# Patient Record
Sex: Female | Born: 1955 | Race: White | Hispanic: No | Marital: Married | Smoking: Former smoker
Health system: Southern US, Community
[De-identification: ages and names within clinical notes are randomized; demographics above are authoritative.]

## PROBLEM LIST (undated history)

## (undated) DIAGNOSIS — R112 Nausea with vomiting, unspecified: Secondary | ICD-10-CM

## (undated) DIAGNOSIS — N838 Other noninflammatory disorders of ovary, fallopian tube and broad ligament: Secondary | ICD-10-CM

## (undated) DIAGNOSIS — Z8719 Personal history of other diseases of the digestive system: Secondary | ICD-10-CM

## (undated) DIAGNOSIS — J302 Other seasonal allergic rhinitis: Secondary | ICD-10-CM

## (undated) DIAGNOSIS — K219 Gastro-esophageal reflux disease without esophagitis: Secondary | ICD-10-CM

## (undated) DIAGNOSIS — Z8711 Personal history of peptic ulcer disease: Secondary | ICD-10-CM

## (undated) DIAGNOSIS — N840 Polyp of corpus uteri: Secondary | ICD-10-CM

## (undated) DIAGNOSIS — K5000 Crohn's disease of small intestine without complications: Secondary | ICD-10-CM

## (undated) DIAGNOSIS — Z9889 Other specified postprocedural states: Secondary | ICD-10-CM

## (undated) DIAGNOSIS — M1990 Unspecified osteoarthritis, unspecified site: Secondary | ICD-10-CM

## (undated) DIAGNOSIS — Z87442 Personal history of urinary calculi: Secondary | ICD-10-CM

## (undated) HISTORY — PX: KIDNEY SURGERY: SHX687

## (undated) HISTORY — PX: TONSILLECTOMY AND ADENOIDECTOMY: SUR1326

## (undated) HISTORY — PX: CYSTOSCOPY/RETROGRADE/URETEROSCOPY/STONE EXTRACTION WITH BASKET: SHX5317

## (undated) HISTORY — DX: Gastro-esophageal reflux disease without esophagitis: K21.9

---

## 2002-02-15 HISTORY — PX: MICROLARYNGOSCOPY WITH CO2 LASER AND EXCISION OF VOCAL CORD LESION: SHX5970

## 2008-02-16 HISTORY — PX: NASAL SINUS SURGERY: SHX719

## 2016-11-30 ENCOUNTER — Encounter: Payer: Self-pay | Admitting: Nurse Practitioner

## 2016-12-14 ENCOUNTER — Encounter: Payer: Self-pay | Admitting: Nurse Practitioner

## 2016-12-14 ENCOUNTER — Ambulatory Visit (INDEPENDENT_AMBULATORY_CARE_PROVIDER_SITE_OTHER): Payer: BLUE CROSS/BLUE SHIELD | Admitting: Nurse Practitioner

## 2016-12-14 ENCOUNTER — Encounter (INDEPENDENT_AMBULATORY_CARE_PROVIDER_SITE_OTHER): Payer: Self-pay

## 2016-12-14 VITALS — BP 118/76 | HR 74 | Ht 65.0 in | Wt 165.0 lb

## 2016-12-14 DIAGNOSIS — R1084 Generalized abdominal pain: Secondary | ICD-10-CM | POA: Diagnosis not present

## 2016-12-14 DIAGNOSIS — R109 Unspecified abdominal pain: Secondary | ICD-10-CM

## 2016-12-14 DIAGNOSIS — R197 Diarrhea, unspecified: Secondary | ICD-10-CM

## 2016-12-14 MED ORDER — NA SULFATE-K SULFATE-MG SULF 17.5-3.13-1.6 GM/177ML PO SOLN: ORAL | 0 refills | Status: DC

## 2016-12-14 NOTE — Progress Notes (Addendum)
Chief complaint:  Abdominal pain and loose stool, ? Hx of Crohn's disease  HPI: Patient is a 61 yo female new to the practice here for evaluation of abdominal pain. She had a normal scrrening colonoscopy in [place] age 36. About 3 years later , around 2012 she developed problems with diarrhea and right sided abdominal pain. Went to see Dr. West Carbo in [place]. Colonoscopy repeated and told normal. Then EGD done and it was normal. This was followed by a pill cam which per patient diagnosed Crohn's disease of small bowel. Sounds like disease was distal SB. She was started on a "compounded" medication, took it for a year with resolution of symptoms. She decided to stop the med since feeling better. Had recurrent sx in 2014, started back on medication and sx improved. Then Dr. West Carbo retired, she was feeling better again and and eventually came off med. Continued to do well until Feb 2017. She established care with another GI in [place]. He repeated EGD. Except for gastritis, it was apparently normal. She has continued to have the same sx. She last saw GI in [place] in July of this year. He ordered several stool studies and labs. Plan was to schedule a CT scan but never got done. Patient kept trying to get in to go over results but never could get appt. She left practice out of frustration . She did bring copies of July labs which I reviewed.  CBC normal.  Liver studies were normal. Stool culture negative.   On 10/8 she had a stomach virus, went to walk in clinic. CBC normal. Chem profile normal. Sent for u/s which was normal except for hepatomegaly (19.7 cm).   She continues to have right sided abdominal abdominal pain and loose stools 5 out of 7 days on average. On average she has only 2-3 loose BMs a day. The pain is right mid abdomen,  almost constant with radiation through to back. Pain not related to eating or defecation. She has worsening joint aches in both hips. Rash on neck but  generally no skin lesions.    Past Medical History:  Diagnosis Date  . Crohn disease ([facility])   . GERD (gastroesophageal reflux disease)   . Kidney stones   . UTI (urinary tract infection)     Past Surgical History:  Procedure Laterality Date  . MICROLARYNGOSCOPY WITH CO2 LASER AND EXCISION OF VOCAL CORD LESION  2004   nodules removed  . NASAL SINUS SURGERY  2010   Family History  Problem Relation Age of Onset  . Breast cancer Mother   . Clotting disorder Mother   . Diabetes Mother   . Liver disease Mother   . Bladder Cancer Father   . Colon polyps Brother    Social History  Substance Use Topics  . Smoking status: Former Research scientist (life sciences)  . Smokeless tobacco: Never Used  . Alcohol use No   Current Outpatient Prescriptions  Medication Sig Dispense Refill  . cetirizine (ZYRTEC) 10 MG tablet Take 10 mg by mouth daily.    . Loratadine (CLARITIN PO) Take by mouth at bedtime.    . Probiotic Product (PROBIOTIC ADVANCED PO) Take by mouth daily.     No current facility-administered medications for this visit.    Allergies  Allergen Reactions  . Codeine     Review of Systems: All systems reviewed and negative except where noted in HPI.   Physical Exam: BP 118/76   Pulse 74   Ht 5\' 5"  (1.651 m)  Wt 165 lb (74.8 kg)   BMI 27.46 kg/m  Constitutional:  Well-developed, white female in no acute distress. Psychiatric: Normal mood and affect. Behavior is normal. EENT: Pupils normal.  Conjunctivae are normal. No scleral icterus. Neck supple.  Cardiovascular: Normal rate, regular rhythm. No edema Pulmonary/chest: Effort normal and breath sounds normal. No wheezing, rales or rhonchi. Abdominal: Soft, nondistended. Nontender. Bowel sounds active throughout. There are no masses palpable. No hepatomegaly. Lymphadenopathy: No cervical adenopathy noted. Neurological: Alert and oriented to person place and time. Skin: Skin is warm and dry. No rashes noted.   ASSESSMENT AND PLAN:  61 yo  female with chronic right mid abdominal pain radiating through to back and also chronic loose stools. Per patient report she was diagnosed with small bowel Crohn's by pill cam in 2012 in [place]. She describes distal small bowel. She took a "compound" medication off an on for a few years. She has tried to get records from Dr. West Carbo but hasn't been successful.  - I think she is going to need repeat colonoscopy with TI intubation. Patient will be scheduled for a colonoscopy with possible polypectomy and biopsies.  The risks and benefits of the procedure were discussed and the patient agrees to proceed.  -Further recommendations will depend on colonoscopy findings. She may come to a repeat capsule endo.  -In the interim she will continue to try and get records from Dr. Anastasio Auerbach practice.   Tye Savoy, NP  12/14/2016, 10:21 AM  Addendum: I have received and reviewed records from Dr. West Carbo in [place]. Records will be scanned Epic. In summary patient started seeing Dr. Madag[place] hour and 2012 when she moved to [facility]. She saw him in 2012 with right-sided abdominal pain. He noted that previous evaluations at outside facilities including colonoscopy, endoscopy with duodenal biopsies, ultrasound and HIDA scan had all been normal. I do not have the actual report but his office note from late 2012 describes colonoscopy with erosions in the terminal ileum with biopsy showing granulomas. She had a Endo capsule study in late 2012 with findings of distal ileal ulcerations, erosions and colitis. He had a good response to prednisone and Entocort. Eventually changed to 6-MP with good results. Last office visit with Dr. Madagascar hour October 2014, presented with Crohn's flare symptoms. She had stopped the 6-MP. At that time patient was restarted on 6-MP 50 mg daily. She was given a course of prednisone as well. At the time of my visit with the patient 12/14/16, she had been off all medications again

## 2016-12-14 NOTE — Patient Instructions (Signed)
If you are age 60 or older, your body mass index should be between 23-30. Your Body mass index is 27.46 kg/m. If this is out of the aforementioned range listed, please consider follow up with your Primary Care Provider.  If you are age 23 or younger, your body mass index should be between 19-25. Your Body mass index is 27.46 kg/m. If this is out of the aformentioned range listed, please consider follow up with your Primary Care Provider.   You have been scheduled for a colonoscopy. Please follow written instructions given to you at your visit today.  Please pick up your prep supplies at the pharmacy within the next 1-3 days. If you use inhalers (even only as needed), please bring them with you on the day of your procedure. Your physician has requested that you go to www.startemmi.com and enter the access code given to you at your visit today. This web site gives a general overview about your procedure. However, you should still follow specific instructions given to you by our office regarding your preparation for the procedure.  We have sent the following medications to your pharmacy for you to pick up at your convenience: Suprep  Thank you for choosing me and [facility].   Tye Savoy, NP

## 2016-12-15 ENCOUNTER — Encounter: Payer: Self-pay | Admitting: Gastroenterology

## 2016-12-16 NOTE — Progress Notes (Signed)
Agree with assessment and plan. With these symptoms I think starting with colonoscopy is reasonable while we await records. May need either enterography study or capsule to reassess for small bowel Crohn's pending the results.

## 2016-12-21 ENCOUNTER — Ambulatory Visit (AMBULATORY_SURGERY_CENTER): Payer: BLUE CROSS/BLUE SHIELD | Admitting: Gastroenterology

## 2016-12-21 ENCOUNTER — Encounter: Payer: Self-pay | Admitting: Gastroenterology

## 2016-12-21 VITALS — BP 112/72 | HR 58 | Temp 98.4°F | Resp 16 | Ht 65.0 in | Wt 165.0 lb

## 2016-12-21 DIAGNOSIS — R197 Diarrhea, unspecified: Secondary | ICD-10-CM

## 2016-12-21 DIAGNOSIS — R109 Unspecified abdominal pain: Secondary | ICD-10-CM | POA: Diagnosis not present

## 2016-12-21 DIAGNOSIS — K319 Disease of stomach and duodenum, unspecified: Secondary | ICD-10-CM | POA: Diagnosis not present

## 2016-12-21 DIAGNOSIS — K599 Functional intestinal disorder, unspecified: Secondary | ICD-10-CM | POA: Diagnosis not present

## 2016-12-21 MED ORDER — SODIUM CHLORIDE 0.9 % IV SOLN: 500.0000 mL | INTRAVENOUS | Status: DC

## 2016-12-21 NOTE — Op Note (Signed)
[facility] Patient Name: Alyssa Strong Procedure Date: 12/21/2016 8:35 AM MRN: [id number] Endoscopist: Remo Lipps P. Armbruster MD, MD Age: 61 Referring MD:  Date of Birth: 1955-08-19 Gender: Female Account #: [id number] Procedure:                Colonoscopy Indications:              Abdominal pain, Chronic diarrhea Medicines:                Monitored Anesthesia Care Procedure:                Pre-Anesthesia Assessment:                           - Prior to the procedure, a History and Physical                            was performed, and patient medications and                            allergies were reviewed. The patient's tolerance of                            previous anesthesia was also reviewed. The risks                            and benefits of the procedure and the sedation                            options and risks were discussed with the patient.                            All questions were answered, and informed consent                            was obtained. Prior Anticoagulants: The patient has                            taken no previous anticoagulant or antiplatelet                            agents. ASA Grade Assessment: II - A patient with                            mild systemic disease. After reviewing the risks                            and benefits, the patient was deemed in                            satisfactory condition to undergo the procedure.                           After obtaining informed consent, the colonoscope  was passed under direct vision. Throughout the                            procedure, the patient's blood pressure, pulse, and                            oxygen saturations were monitored continuously. The                            Colonoscope was introduced through the anus and                            advanced to the the terminal ileum, with                            identification of the appendiceal  orifice and IC                            valve. The colonoscopy was performed without                            difficulty. The patient tolerated the procedure                            well. The quality of the bowel preparation was                            good. The terminal ileum, ileocecal valve,                            appendiceal orifice, and rectum were photographed. Scope In: 8:40:25 AM Scope Out: 8:54:02 AM Scope Withdrawal Time: 0 hours 10 minutes 16 seconds  Total Procedure Duration: 0 hours 13 minutes 37 seconds  Findings:                 The perianal and digital rectal examinations were                            normal.                           The terminal ileum contained two small erosions.                            Biopsies were taken with a cold forceps for                            histology. The ileum was otherwise normal.                           A few small-mouthed diverticula were found in the                            sigmoid colon.  Anal papilla(e) were hypertrophied.                           The exam was otherwise without abnormality. No                            overt inflammatory changes.                           Biopsies for histology were taken with a cold                            forceps from the right colon, left colon and                            transverse colon for evaluation of microscopic                            colitis. Complications:            No immediate complications. Estimated blood loss:                            Minimal. Estimated Blood Loss:     Estimated blood loss was minimal. Impression:               - Two erosions in the terminal ileum. Biopsied.                           - Diverticulosis in the sigmoid colon.                           - Anal papilla(e) were hypertrophied.                           - The examination was otherwise normal.                           - Biopsies were taken  with a cold forceps from the                            right colon, left colon and transverse colon for                            evaluation of microscopic colitis. Recommendation:           - Patient has a contact number available for                            emergencies. The signs and symptoms of potential                            delayed complications were discussed with the                            patient. Return to normal activities tomorrow.  Written discharge instructions were provided to the                            patient.                           - Resume previous diet.                           - Continue present medications.                           - Await pathology results.                           - Repeat colonoscopy in 10 years for screening                            purposes. Remo Lipps P. Armbruster MD, MD 12/21/2016 8:58:08 AM This report has been signed electronically.

## 2016-12-21 NOTE — Patient Instructions (Signed)
**  Handout given on diverticulosis**   YOU HAD AN ENDOSCOPIC PROCEDURE TODAY: Refer to the procedure report and other information in the discharge instructions given to you for any specific questions about what was found during the examination. If this information does not answer your questions, please call [facility] office at [phone] to clarify.   YOU SHOULD EXPECT: Some feelings of bloating in the abdomen. Passage of more gas than usual. Walking can help get rid of the air that was put into your GI tract during the procedure and reduce the bloating. If you had a lower endoscopy (such as a colonoscopy or flexible sigmoidoscopy) you may notice spotting of blood in your stool or on the toilet paper. Some abdominal soreness may be present for a day or two, also.  DIET: Your first meal following the procedure should be a light meal and then it is ok to progress to your normal diet. A half-sandwich or bowl of soup is an example of a good first meal. Heavy or fried foods are harder to digest and may make you feel nauseous or bloated. Drink plenty of fluids but you should avoid alcoholic beverages for 24 hours. If you had a esophageal dilation, please see attached instructions for diet.    ACTIVITY: Your care partner should take you home directly after the procedure. You should plan to take it easy, moving slowly for the rest of the day. You can resume normal activity the day after the procedure however YOU SHOULD NOT DRIVE, use power tools, machinery or perform tasks that involve climbing or major physical exertion for 24 hours (because of the sedation medicines used during the test).   SYMPTOMS TO REPORT IMMEDIATELY: A gastroenterologist can be reached at any hour. Please call [phone]  for any of the following symptoms:  Following lower endoscopy (colonoscopy, flexible sigmoidoscopy) Excessive amounts of blood in the stool  Significant tenderness, worsening of abdominal pains  Swelling of the  abdomen that is new, acute  Fever of 100 or higher    FOLLOW UP:  If any biopsies were taken you will be contacted by phone or by letter within the next 1-3 weeks. Call [phone]  if you have not heard about the biopsies in 3 weeks.  Please also call with any specific questions about appointments or follow up tests.

## 2016-12-21 NOTE — Progress Notes (Signed)
Called to room to assist during endoscopic procedure.  Patient ID and intended procedure confirmed with present staff. Received instructions for my participation in the procedure from the performing physician.  

## 2016-12-21 NOTE — Progress Notes (Signed)
Report to PACU, RN, vss, BBS= Clear.  

## 2016-12-22 ENCOUNTER — Telehealth: Payer: Self-pay

## 2016-12-22 NOTE — Telephone Encounter (Signed)
  Follow up Call-  Call back number 12/21/2016  Post procedure Call Back phone  # [phone]  Permission to leave phone message Yes     Patient questions:  Do you have a fever, pain , or abdominal swelling? No. Pain Score  0 *  Have you tolerated food without any problems? Yes.    Have you been able to return to your normal activities? Yes.    Do you have any questions about your discharge instructions: Diet   No. Medications  No. Follow up visit  No.  Do you have questions or concerns about your Care? No.  Actions: * If pain score is 4 or above: No action needed, pain <4.  No problems note per pt.  Pt stated, "Thank you for a great experience".  maw

## 2016-12-29 ENCOUNTER — Other Ambulatory Visit: Payer: Self-pay

## 2016-12-29 DIAGNOSIS — R197 Diarrhea, unspecified: Secondary | ICD-10-CM

## 2017-01-17 ENCOUNTER — Encounter: Payer: Self-pay | Admitting: Gastroenterology

## 2017-01-17 ENCOUNTER — Ambulatory Visit (INDEPENDENT_AMBULATORY_CARE_PROVIDER_SITE_OTHER): Payer: BLUE CROSS/BLUE SHIELD

## 2017-01-17 DIAGNOSIS — R197 Diarrhea, unspecified: Secondary | ICD-10-CM

## 2017-01-17 DIAGNOSIS — K5000 Crohn's disease of small intestine without complications: Secondary | ICD-10-CM

## 2017-01-17 DIAGNOSIS — R109 Unspecified abdominal pain: Secondary | ICD-10-CM

## 2017-01-17 NOTE — Progress Notes (Signed)
Patient here for capsule endo.  Patient tolerated well.  Lot # [id number] lot [id number] exp 03/052020

## 2017-02-01 ENCOUNTER — Telehealth: Payer: Self-pay

## 2017-02-01 ENCOUNTER — Telehealth: Payer: Self-pay | Admitting: Gastroenterology

## 2017-02-01 NOTE — Telephone Encounter (Signed)
Have you seen these results yet? Most likely not since you were hospital doc last week. Thanks.

## 2017-02-01 NOTE — Telephone Encounter (Signed)
Spoke to patient let her know that results have not been reviewed yet. We will contact her when available.

## 2017-02-02 NOTE — Telephone Encounter (Signed)
Called back and left patient a message.  Results of capsule returned today There is a very small gastric ulcer / erosion, along with a few ileal ulcerations, consistent with mild Crohn's disease. I will discuss results with her and treatment options. Will call back

## 2017-02-03 ENCOUNTER — Other Ambulatory Visit: Payer: Self-pay | Admitting: Gastroenterology

## 2017-02-03 DIAGNOSIS — K529 Noninfective gastroenteritis and colitis, unspecified: Secondary | ICD-10-CM

## 2017-02-03 DIAGNOSIS — K2970 Gastritis, unspecified, without bleeding: Secondary | ICD-10-CM

## 2017-02-03 NOTE — Telephone Encounter (Signed)
Pt states she is returning phone call regarding results. Best call back # [phone].

## 2017-02-03 NOTE — Telephone Encounter (Signed)
I called the patient and discussed results. 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 We discussed treatment options. Given her Crohn's is mild, we may consider budesonide as needed and avoiding chronic maintenance therapy, which she states is her preference if possible. We discussed natural history of Crohns, risks for worsening disease and bowel obstruction. She is having some mild symptoms at this time, she has responded to budesonide in the past, will give her 97m / day for 4 weeks, then 667m/ day for 2 weeks, and then 76m36m day for 2 weeks and then stop. We will see if this provides her some relief. Moving forward, if she needs budesonide once or twice per year to control symptoms that would be okay. However if she can't come off it without worsening symptoms or using it frequently, would then need to discuss biologic therapy or immunomodulators. She agreed. Otherwise will start omeprazole given gastritis and small ulcer noted on capsule. I would like to see her back in a few months for reassessment.  [facility]. Can you please contact her to schedule a routine follow up in 2 months or so. Thanks

## 2017-02-04 NOTE — Telephone Encounter (Signed)
Pt scheduled to see Dr. Havery Moros 04/08/17@9 :45am, Appt letter mailed to pt.

## 2017-04-08 ENCOUNTER — Ambulatory Visit: Payer: BLUE CROSS/BLUE SHIELD | Admitting: Gastroenterology

## 2017-04-27 ENCOUNTER — Other Ambulatory Visit (INDEPENDENT_AMBULATORY_CARE_PROVIDER_SITE_OTHER): Payer: BLUE CROSS/BLUE SHIELD

## 2017-04-27 ENCOUNTER — Encounter (INDEPENDENT_AMBULATORY_CARE_PROVIDER_SITE_OTHER): Payer: Self-pay

## 2017-04-27 ENCOUNTER — Ambulatory Visit: Payer: BLUE CROSS/BLUE SHIELD | Admitting: Gastroenterology

## 2017-04-27 ENCOUNTER — Encounter: Payer: Self-pay | Admitting: Gastroenterology

## 2017-04-27 VITALS — BP 130/86 | HR 74 | Ht 65.0 in | Wt 164.0 lb

## 2017-04-27 DIAGNOSIS — R109 Unspecified abdominal pain: Secondary | ICD-10-CM | POA: Diagnosis not present

## 2017-04-27 DIAGNOSIS — G8929 Other chronic pain: Secondary | ICD-10-CM | POA: Diagnosis not present

## 2017-04-27 DIAGNOSIS — K50019 Crohn's disease of small intestine with unspecified complications: Secondary | ICD-10-CM

## 2017-04-27 DIAGNOSIS — M549 Dorsalgia, unspecified: Secondary | ICD-10-CM

## 2017-04-27 DIAGNOSIS — K5000 Crohn's disease of small intestine without complications: Secondary | ICD-10-CM | POA: Diagnosis not present

## 2017-04-27 LAB — CBC WITH DIFFERENTIAL/PLATELET
Basophils Absolute: 0 10*3/uL (ref 0.0–0.1)
Basophils Relative: 0.7 % (ref 0.0–3.0)
EOS ABS: 0.1 10*3/uL (ref 0.0–0.7)
Eosinophils Relative: 1.4 % (ref 0.0–5.0)
HEMATOCRIT: 40.9 % (ref 36.0–46.0)
Hemoglobin: 13.9 g/dL (ref 12.0–15.0)
LYMPHS ABS: 1.5 10*3/uL (ref 0.7–4.0)
LYMPHS PCT: 26 % (ref 12.0–46.0)
MCHC: 33.9 g/dL (ref 30.0–36.0)
MCV: 90.7 fl (ref 78.0–100.0)
Monocytes Absolute: 0.3 10*3/uL (ref 0.1–1.0)
Monocytes Relative: 5.3 % (ref 3.0–12.0)
NEUTROS ABS: 3.7 10*3/uL (ref 1.4–7.7)
NEUTROS PCT: 66.6 % (ref 43.0–77.0)
PLATELETS: 233 10*3/uL (ref 150.0–400.0)
RBC: 4.51 Mil/uL (ref 3.87–5.11)
RDW: 13 % (ref 11.5–15.5)
WBC: 5.6 10*3/uL (ref 4.0–10.5)

## 2017-04-27 LAB — COMPREHENSIVE METABOLIC PANEL
ALT: 15 U/L (ref 0–35)
AST: 16 U/L (ref 0–37)
Albumin: 4.4 g/dL (ref 3.5–5.2)
Alkaline Phosphatase: 51 U/L (ref 39–117)
BILIRUBIN TOTAL: 0.6 mg/dL (ref 0.2–1.2)
BUN: 12 mg/dL (ref 6–23)
CHLORIDE: 103 meq/L (ref 96–112)
CO2: 31 meq/L (ref 19–32)
CREATININE: 0.85 mg/dL (ref 0.40–1.20)
Calcium: 10.1 mg/dL (ref 8.4–10.5)
GFR: 72.39 mL/min (ref >60.00)
GLUCOSE: 98 mg/dL (ref 70–99)
Potassium: 5 mEq/L (ref 3.5–5.1)
SODIUM: 139 meq/L (ref 135–145)
Total Protein: 7.6 g/dL (ref 6.0–8.3)

## 2017-04-27 LAB — C-REACTIVE PROTEIN: CRP: 0.5 mg/dL (ref 0.5–20.0)

## 2017-04-27 NOTE — Progress Notes (Signed)
HPI :  62 year old female here for follow-up visit  Crohn's history: Diagnosed late 2012 with colonoscopy with erosions in the terminal ileum with biopsy showing granulomas. She had a Capsule study late 2012 with findings of distal ileal ulcerations, erosions and colitis. He had a good response to steroids initially. Eventually changed to 6-MP with good results. Stopped it and had a flare, took again for a period of time and had stopped it when she represented to our office in 11/2016.  Colonoscopy 12/2016 with 2 small erosions in TI but biopsies normal. Capsule endoscopy with mild small bowel Crohn's disease.   SINCE LAST VISIT  The patient had colonoscopy and capsule endoscopy as outlined below since her last visit.  Overall findings are most consistent with mild small bowel Crohn's disease.  She was given a trial of budesonide 56m / day for one month and then slow taper. Started omeprazole for gastritis and small gastric ulcer noted.  She states her diarrhea improved with budesonide with that course and is continued to feel okay in this regard.  She is having one soft use loose stool per day without any blood.  She does continue to complain of abdominal and back pain that bothers her pain is in the right lower back wraps around to the right midabdomen and also radiates down her right leg into her right foot.  She states it is there mostly all the time and can have some relationship with movements.  She denies any discomfort after she eats or with changes in her bowel habits.  She denies any NSAID.  She has some massage therapy for her back which she states helps.  She questions a prior ultrasound of her abdomen that was done last year showing hepatomegaly.  Her LFTs have been normal.  Her mother did have cirrhosis from fatty liver disease.  She states her reflux symptoms are well controlled on PPI. She has completed her course of budesonide her bowels continue to feel fairly regular at this  time.   Recent workup: Colonoscopy 12/21/2016 - 2 small erosions in the TI, sigmoid diverticulosis, normal colon otherwise - biopsies were normal. Colon biopsies normal.   Capsule endoscopy 01/17/2017 - gastritis, 2 ulcerations seen in the distal small bowel - suggestive of mild Crohn's disease    Past Medical History:  Diagnosis Date  . [place]   . [place]    small bowel   . GERD (gastroesophageal reflux disease)   . Kidney stones   . UTI (urinary tract infection)      Past Surgical History:  Procedure Laterality Date  . MICROLARYNGOSCOPY WITH CO2 LASER AND EXCISION OF VOCAL CORD LESION  2004   nodules removed  . NASAL SINUS SURGERY  2010   Family History  Problem Relation Age of Onset  . Breast cancer Mother   . Clotting disorder Mother   . Diabetes Mother   . Liver disease Mother   . Bladder Cancer Father   . Colon polyps Brother    Social History   Tobacco Use  . Smoking status: Former SResearch scientist (life sciences) . Smokeless tobacco: Never Used  Substance Use Topics  . Alcohol use: No  . Drug use: No   Current Outpatient Medications  Medication Sig Dispense Refill  . cetirizine (ZYRTEC) 10 MG tablet Take 10 mg by mouth daily.    . Loratadine (CLARITIN PO) Take by mouth at bedtime.    .Marland Kitchenomeprazole (PRILOSEC) 40 MG capsule Take 40 mg by  mouth daily.    . Probiotic Product (PROBIOTIC ADVANCED PO) Take by mouth daily.     No current facility-administered medications for this visit.    Allergies  Allergen Reactions  . Codeine      Review of Systems: All systems reviewed and negative except where noted in HPI.   Lab Results  Component Value Date   WBC 5.6 04/27/2017   HGB 13.9 04/27/2017   HCT 40.9 04/27/2017   MCV 90.7 04/27/2017   PLT 233.0 04/27/2017    Lab Results  Component Value Date   CREATININE 0.85 04/27/2017   BUN 12 04/27/2017   NA 139 04/27/2017   K 5.0 04/27/2017   CL 103 04/27/2017   CO2 31 04/27/2017    Lab Results   Component Value Date   ALT 15 04/27/2017   AST 16 04/27/2017   ALKPHOS 51 04/27/2017   BILITOT 0.6 04/27/2017     Physical Exam: BP 130/86   Pulse 74   Ht 5' 5"  (1.651 m)   Wt 164 lb (74.4 kg)   BMI 27.29 kg/m  Constitutional: Pleasant,well-developed, female in no acute distress. HEENT: Normocephalic and atraumatic. Conjunctivae are normal. No scleral icterus. Neck supple.  Cardiovascular: Normal rate, regular rhythm.  Pulmonary/chest: Effort normal and breath sounds normal. No wheezing, rales or rhonchi. Abdominal: Soft, nondistended, nontender. There are no masses palpable. No hepatomegaly. Extremities: no edema Lymphadenopathy: No cervical adenopathy noted. Neurological: Alert and oriented to person place and time. Skin: Skin is warm and dry. No rashes noted. Psychiatric: Normal mood and affect. Behavior is normal.   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  While her biopsies were negative on colonoscopy her capsule findings are very suggestive.  She otherwise had biopsies from her initial colonoscopy in 2012 that showed granulomas and more consistent with Crohn's disease.  She has had no complications of Crohn's disease to date and no prior surgeries.  I discussed options with her moving forward.  I will recheck her labs today to ensure okay, however pending no anemia and lack of any symptoms that are attributed to Crohn's at present time, given that her disease is mild we can use budesonide as needed for symptoms versus resuming a thiopurine or biologic for maintenance therapy.  I discussed immunosuppressive options with her and she wishes to with hold on therapy at this time and use budesonide as needed, given she is feeling well and her disease is mild.  As below I do not think her current pain is caused by her IBD but will  clarify this by obtaining CT enterography.  Chronic abdominal pain / back pain - I think her abdominal pain may stem from her back pain, she also has some radicular symptoms on the right leg.  I offered her a CT enterography to reassess her Crohn's disease since she has not had cross-sectional imaging in a while, I ensure no active Crohn's or other pathology to cause her symptoms, she is very anxious about this.  We will otherwise await basic blood work.  Pending her CT scan looks good we will continue to withhold further maintenance therapy for Crohn's as outlined above.  She may benefit from physical therapy for her back pain.  Robinson Cellar, MD [facility] Pager [phone]

## 2017-04-27 NOTE — Patient Instructions (Signed)
If you are age 61 or older, your body mass index should be between 23-30. Your Body mass index is 27.29 kg/m. If this is out of the aforementioned range listed, please consider follow up with your Primary Care Provider.  If you are age 70 or younger, your body mass index should be between 19-25. Your Body mass index is 27.29 kg/m. If this is out of the aformentioned range listed, please consider follow up with your Primary Care Provider.   ______________________________________________________________________  Alyssa Strong have been scheduled for a CT scan of the abdomen and pelvis at [facility] ([address]---this is in the same building as Press photographer).   You are scheduled on Thursday, 04-28-17 at 2:30pm. You should arrive at 1:15pm (90 minutes prior to your appointment time) for registration.   Do not eat or after 10:30am (4 hours prior to your test)  This test itself takes typically takes 30-45 minutes to complete.  If you have any questions regarding your exam or if you need to reschedule, you may call the CT department at [phone] between the hours of 8:00 am and 5:00 pm, Monday-Friday.  ________________________________________________________________________  Please go to the lab in the basement of our building to have lab work done as you leave today.   Thank you for entrusting me with your care and for choosing [facility], Dr. Burton Cellar

## 2017-04-28 ENCOUNTER — Ambulatory Visit (INDEPENDENT_AMBULATORY_CARE_PROVIDER_SITE_OTHER)
Admission: RE | Admit: 2017-04-28 | Discharge: 2017-04-28 | Disposition: A | Discharge status: Home / Self Care | Payer: BLUE CROSS/BLUE SHIELD | Source: Ambulatory Visit | Attending: Gastroenterology | Admitting: Gastroenterology

## 2017-04-28 DIAGNOSIS — R109 Unspecified abdominal pain: Secondary | ICD-10-CM | POA: Diagnosis not present

## 2017-04-28 DIAGNOSIS — K5000 Crohn's disease of small intestine without complications: Secondary | ICD-10-CM | POA: Diagnosis not present

## 2017-04-28 DIAGNOSIS — M549 Dorsalgia, unspecified: Secondary | ICD-10-CM

## 2017-04-28 DIAGNOSIS — G8929 Other chronic pain: Secondary | ICD-10-CM

## 2017-04-28 MED ORDER — IOPAMIDOL (ISOVUE-300) INJECTION 61%
100.0000 mL | Freq: Once | INTRAVENOUS | Status: AC | PRN
  Administered 2017-04-28: 100 mL via INTRAVENOUS

## 2017-04-29 ENCOUNTER — Other Ambulatory Visit: Payer: Self-pay

## 2017-04-29 DIAGNOSIS — R1900 Intra-abdominal and pelvic swelling, mass and lump, unspecified site: Secondary | ICD-10-CM

## 2017-04-29 DIAGNOSIS — R935 Abnormal findings on diagnostic imaging of other abdominal regions, including retroperitoneum: Secondary | ICD-10-CM

## 2017-04-29 DIAGNOSIS — N7092 Oophoritis, unspecified: Secondary | ICD-10-CM

## 2017-05-04 ENCOUNTER — Ambulatory Visit (HOSPITAL_COMMUNITY)
Admission: RE | Admit: 2017-05-04 | Discharge: 2017-05-04 | Disposition: A | Discharge status: Home / Self Care | Payer: BLUE CROSS/BLUE SHIELD | Source: Ambulatory Visit | Attending: Gastroenterology | Admitting: Gastroenterology

## 2017-05-04 DIAGNOSIS — N838 Other noninflammatory disorders of ovary, fallopian tube and broad ligament: Secondary | ICD-10-CM | POA: Insufficient documentation

## 2017-05-04 DIAGNOSIS — N7092 Oophoritis, unspecified: Secondary | ICD-10-CM

## 2017-05-04 DIAGNOSIS — R935 Abnormal findings on diagnostic imaging of other abdominal regions, including retroperitoneum: Secondary | ICD-10-CM

## 2017-05-05 ENCOUNTER — Telehealth: Payer: Self-pay

## 2017-05-05 ENCOUNTER — Other Ambulatory Visit: Payer: Self-pay

## 2017-05-05 DIAGNOSIS — N838 Other noninflammatory disorders of ovary, fallopian tube and broad ligament: Secondary | ICD-10-CM

## 2017-05-05 NOTE — Telephone Encounter (Signed)
Patient is aware of her results. Appointment with GYN in [place], [place] per patient request. Dr Phineas Real with [facility] [phone] on 05/17/17 at 10:00 am. New patient information will be mailed to her. Contact information given to the patient.

## 2017-05-05 NOTE — Telephone Encounter (Signed)
-----   Message from Ladene Artist, MD sent at 05/05/2017  9:13 AM EDT ----- 1. The right ovary is heterogeneous and appears enlarged; contiguous or adjacent to the right ovary is a solid appearing hypoechoic mass measuring 3.7 cm. Surgical consultation is recommended. 2. Nonvisualized left ovary  Refer to her GYN as soon as possible to further evaluate lesion noted.   MS  ----- Message ----- From: Greggory Keen, LPN Sent: 07/20/5407   8:12 AM To: Ladene Artist, MD  Please review the transvaginal u/s ordered by Dr Havery Moros. Thank you.

## 2017-05-05 NOTE — Telephone Encounter (Signed)
Thanks Alyssa Strong, I'm just seeing this now, I agree with recommendations and surgical consultation

## 2017-05-11 ENCOUNTER — Encounter: Payer: Self-pay | Admitting: Gynecology

## 2017-05-11 ENCOUNTER — Ambulatory Visit: Payer: BLUE CROSS/BLUE SHIELD | Admitting: Gynecology

## 2017-05-11 VITALS — BP 120/70 | Ht 64.0 in | Wt 165.0 lb

## 2017-05-11 DIAGNOSIS — N838 Other noninflammatory disorders of ovary, fallopian tube and broad ligament: Secondary | ICD-10-CM

## 2017-05-11 DIAGNOSIS — N839 Noninflammatory disorder of ovary, fallopian tube and broad ligament, unspecified: Secondary | ICD-10-CM

## 2017-05-11 NOTE — Patient Instructions (Signed)
Office will call you with the blood test results.  Follow-up for ultrasound as scheduled.

## 2017-05-11 NOTE — Progress Notes (Signed)
Kyrstin Campillo 06/13/1955 [id number]        62 y.o.  G1P1 new patient who presents with her daughter who is a PA who had a CT scan done in April due to abdominal and back pain.  The CT scan was normal with the exception of a "suggestion of soft tissue fullness within the right ovary/adnexa for age.  Example at 4.3 x 3 cm".  No ascites or lymphadenopathy noted.  The patient had a follow-up pelvic ultrasound which describes a normal-appearing uterus with endometrial echo 4.8 mm.  Small echogenic foci at the fundus possible calcification.  Tiny cysts in the endometrial echoes.  Left ovary not visualized.  Right ovary measures 3.2 x 2.2 x 2.2.  Right adnexal hypoechoic solid-appearing mass 3.7 x 2.2 x 2.9 cm.  Surgical consultation was recommended and the patient presents in follow-up.  Right sided pain is primarily back radiating to the right flank.  Not pelvic in nature.  No history of gynecologic abnormalities in the past.  No abnormal Pap smears.  No bleeding.  Reports recent full GYN exam to include Pap smear in January.  Past medical history,surgical history, problem list, medications, allergies, family history and social history were all reviewed and documented in the EPIC chart.  Directed ROS with pertinent positives and negatives documented in the history of present illness/assessment and plan.  Exam: [name] assistant Vitals:   05/11/17 0904  BP: 120/70  Weight: 165 lb (74.8 kg)  Height: 5\' 4"  (1.626 m)   General appearance:  Normal Abdomen soft nontender without masses guarding rebound Pelvic external BUS vagina with atrophic changes.  Cervix with atrophic changes.  Uterus normal size midline mobile nontender.  Adnexa without masses or tenderness.  Rectal exam is normal.  Assessment/Plan:  62 y.o. G1P1 with newly discovered right adnexal solid area questionable contiguous with the ovary.  In review of the ultrasound pictures it does appear to be contiguous with the right ovary.   Doppler studies although not commented on in the report appear to show Doppler flow to the ovary but no flow to this area.  Having right-sided pain but this is higher up and I think unrelated to this area.  The patient, her daughter and I reviewed the situation and the ultrasound pictures.  We discussed that this may be a new finding or may have been present for years undiscovered until studies done for other reasons now.  Differential to include normal ovary, solid tumors such as fibroma up to and including ovarian cancer were reviewed.  Given the small size and homogeneous nature as well as lack of flow from my film interpretations unlikely to be malignant although no guarantees.  Options for management were reviewed with them to include surgery such as laparoscopic BSO versus observation with serial ultrasounds for stability.  We also discussed CA 125 screening.  She apparently has a history of Crohn's disease in the past but it appears to be inactive at this time.  The possibilities of false positives with CA 125 screening were reviewed as well as a negative screen does not guarantee that it is not cancer discussed.  After lengthy discussion the patient does prefer to go ahead and have the CA 125 drawn today.  Will plan on follow-up ultrasound here in a month or so to relook at this area and then to finalize our plan as far as expectant versus surgery.  The patient and her daughter's questions were answered and they are comfortable with the  plan.  Greater than 50% of my 25-minute office visit was spent in direct face to face counseling and coordination of care with the patient.     Anastasio Auerbach MD, 10:24 AM 05/11/2017

## 2017-05-12 LAB — CA 125: CA 125: 9 U/mL (ref <35)

## 2017-05-17 ENCOUNTER — Ambulatory Visit: Payer: Self-pay | Admitting: Gynecology

## 2017-05-18 ENCOUNTER — Ambulatory Visit: Payer: BLUE CROSS/BLUE SHIELD | Admitting: Gastroenterology

## 2017-05-31 ENCOUNTER — Ambulatory Visit (INDEPENDENT_AMBULATORY_CARE_PROVIDER_SITE_OTHER): Payer: BLUE CROSS/BLUE SHIELD | Admitting: Gynecology

## 2017-05-31 ENCOUNTER — Ambulatory Visit (INDEPENDENT_AMBULATORY_CARE_PROVIDER_SITE_OTHER): Payer: BLUE CROSS/BLUE SHIELD

## 2017-05-31 ENCOUNTER — Encounter: Payer: Self-pay | Admitting: Gynecology

## 2017-05-31 ENCOUNTER — Other Ambulatory Visit: Payer: Self-pay | Admitting: Gynecology

## 2017-05-31 VITALS — BP 118/74

## 2017-05-31 DIAGNOSIS — N838 Other noninflammatory disorders of ovary, fallopian tube and broad ligament: Secondary | ICD-10-CM

## 2017-05-31 DIAGNOSIS — N839 Noninflammatory disorder of ovary, fallopian tube and broad ligament, unspecified: Secondary | ICD-10-CM | POA: Diagnosis not present

## 2017-05-31 DIAGNOSIS — N949 Unspecified condition associated with female genital organs and menstrual cycle: Secondary | ICD-10-CM | POA: Diagnosis not present

## 2017-05-31 DIAGNOSIS — N9489 Other specified conditions associated with female genital organs and menstrual cycle: Secondary | ICD-10-CM

## 2017-05-31 DIAGNOSIS — N840 Polyp of corpus uteri: Secondary | ICD-10-CM

## 2017-05-31 NOTE — Patient Instructions (Signed)
Office will call you to arrange for the surgery 

## 2017-05-31 NOTE — Progress Notes (Signed)
    Alyssa Strong Jan 11, 1956 [id number]        62 y.o.  G1P1 presents for follow-up ultrasound.  History of CT scan for abdominal pain showed a suggestion of a soft tissue fullness within the right ovary/adnexa.  Measurement 4.3 x 3 cm.  No ascites or lymphadenopathy noted.  The patient had a follow-up ultrasound which showed an endometrial echo at 4.8 mm with small echogenic foci at the fundus.  Tiny cyst within the endometrial echoes.  Left ovary not visualized.  Right ovary measuring 3.2 x 2.2 x 2.2 cm.  Right adnexal hypoechoic solid-appearing mass 3.7 x 2.2 x 2.9 cm.  CA 125 last month was 9  Past medical history,surgical history, problem list, medications, allergies, family history and social history were all reviewed and documented in the EPIC chart.  Directed ROS with pertinent positives and negatives documented in the history of present illness/assessment and plan.  Exam: Vitals:   05/31/17 1605  BP: 118/74   General appearance:  Normal  Ultrasound transvaginal shows uterus normal size and echotexture.  Endometrial echo 8.8 mm.  Color flow noted within the cystic solid endometrium with focal area measuring 21 x 9 mm.  Right adnexal mass unable to separate from right ovary lobulated measuring 64 x 26 x 29 mm solid in appearance with central color flow Doppler.  Left ovary atrophic.  Cul-de-sac negative  Assessment/Plan:  62 y.o. G1P1 with prominent right ovarian lobulated mass questionable ovary versus separate tumor.  Recent CA 125 negative.  Endometrium with apparent polyp.  Patient without history of bleeding or other GYN pathology in the past.  Options for management were reviewed with the patient to include referral to GYN oncology input, proceeding with laparoscopic BSO and hysteroscopic endometrial polypectomy or proceeding with hysteroscopic polypectomy with expectant management of the right adnexal area.  The question is whether there is pathology versus changes that have been present  for years but never visualized previously.  After lengthy discussion to include the risks of surgery and her history of Crohn's disease although patient notes it is only recently been an issue and considered a very mild case never treated medically the patient wants to proceed with laparoscopic BSO and hysteroscopic polypectomy.  We discussed the issues of full hysterectomy versus the 2 separate procedures and reviewed the relative risks of both approaches.   Anastasio Auerbach MD, 4:38 PM 05/31/2017

## 2017-06-02 ENCOUNTER — Telehealth: Payer: Self-pay

## 2017-06-02 MED ORDER — MISOPROSTOL 200 MCG PO TABS: ORAL_TABLET | ORAL | 0 refills | Status: DC

## 2017-06-02 NOTE — Telephone Encounter (Signed)
I called patient to discuss scheduling surgery.  We reviewed her insurance benefits. She has met her deductible and insurance is paying claims at 100% so no surgery prepaymt due.  She considered dates and spoke with her husband about this and decided taht May 28 9:00am at [facility] worked best for their schedule.  We discussed need for Bowel Prep day before surgery. I am mailing her bowel prep instructions and asked her to go ahead and get her OTC products and read through the instructions to be sure she does not have any questions since the office will be closed the day she will be doing her bowel prep.  I, also, discussed with her need for Cytotec tab vaginally hs before surgery and that Rx was sent.

## 2017-06-14 ENCOUNTER — Encounter: Payer: Self-pay | Admitting: Gynecology

## 2017-06-24 ENCOUNTER — Encounter: Payer: Self-pay | Admitting: Gynecology

## 2017-06-28 ENCOUNTER — Ambulatory Visit: Payer: BLUE CROSS/BLUE SHIELD | Admitting: Gynecology

## 2017-06-28 ENCOUNTER — Encounter: Payer: Self-pay | Admitting: Gynecology

## 2017-06-28 VITALS — BP 114/70

## 2017-06-28 DIAGNOSIS — N840 Polyp of corpus uteri: Secondary | ICD-10-CM | POA: Diagnosis not present

## 2017-06-28 DIAGNOSIS — N838 Other noninflammatory disorders of ovary, fallopian tube and broad ligament: Secondary | ICD-10-CM

## 2017-06-28 DIAGNOSIS — N839 Noninflammatory disorder of ovary, fallopian tube and broad ligament, unspecified: Secondary | ICD-10-CM | POA: Diagnosis not present

## 2017-06-28 NOTE — Progress Notes (Signed)
    Alyssa Strong [birth year] [id number]        62 y.o.  G1P1 presents with her husband for her preoperative visit for her upcoming laparoscopic BSO and hysteroscopic resection endometrial polyp.  History as outlined in her 05/31/2017 note.  Past medical history,surgical history, problem list, medications, allergies, family history and social history were all reviewed and documented in the EPIC chart.  Directed ROS with pertinent positives and negatives documented in the history of present illness/assessment and plan.  Exam: [name] assistant Vitals:   06/28/17 1222  BP: 114/70   General appearance:  Normal HEENT normal Lungs clear Cardiac regular rate without rubs murmurs or gallops Abdomen soft nontender without masses guarding rebound Pelvic external BUS vagina with atrophic changes.  Cervix with atrophic changes.  Uterus grossly normal midline mobile nontender.  Adnexa without gross masses or tenderness.  Assessment/Plan:  62 y.o. G1P1 with history of right ovarian solid-appearing area with Doppler flow measuring 64 x 26 x 29 mm and a cystic solid endometrial area measuring 21 x 9 mm with color flow consistent with polyp for laparoscopic BSO and hysteroscopy D&C with resection of the endometrial polyp.  I reviewed the proposed surgery with the patient and her husband to include the expected intraoperative and postoperative courses as well as the recovery period.  We discussed various pathology possibilities for both of these areas to include benign and malignant.  She understands that we will attempt to remove both ovaries and fallopian tubes.  If the area does not appear to be ovarian then she also understands that I may call in a consultant such as a general surgeon which may lead to a different type of surgery or more extensive surgery.  She also understands that if it appears that significant surgery is required to remove the mass that we may stop with a diagnostic laparoscopy and  reschedule a more appropriate surgery either with robotics or a gynecologic oncologist.  We also discussed about proceeding with a hysterectomy at the same time but if BSO and hysteroscopic resection easily accomplished then risks/recovery would be better with a more limited procedure.  The expected intraoperative and postoperative courses were reviewed for both procedures to include instrumentation, use of sharp blunt dissection, electrocautery, harmonic scalpel, hysteroscopy and the D&C portion.  The risks of infection, prolonged antibiotics, reoperation for abscess or hematoma formation was discussed. The risks of hemorrhage necessitating transfusion and the risks of transfusion reaction, hepatitis, HIV, mad cow disease and other unknown entities was also discussed. Incisional complications to include opening and draining of incisions and closure by secondary intention, dehiscence and long-term issues of keloid/cosmetics and hernia formation were reviewed. The risk of inadvertent injury to internal organs including vagina, cervix, uterus with uterine perforation, bowel, bladder, ureters, vessels, nerves either immediately recognized or delay recognized necessitating major exploratory reparative surgeries and future reparative surgeries including bowel resection, ostomy formation, bladder repair, ureteral damage repair was discussed with her. The patient's questions were answered to her satisfaction and she is ready to proceed with surgery.   Anastasio Auerbach MD, 12:54 PM 06/28/2017

## 2017-06-28 NOTE — Patient Instructions (Signed)
Followup for surgery as scheduled. 

## 2017-06-28 NOTE — H&P (Signed)
Alyssa Strong Nov 30, 1955 [id number]   History and Physical  Chief complaint: Right ovarian mass and endometrial polyp  History of present illness: 62 y.o. G1P1 initially presented with abdominal pain with subsequent CT suggesting soft tissue fullness in the right ovary/adnexal area.  This area measured 4.3 x 3 cm.  No ascites or lymphadenopathy noted.  Follow-up ultrasound showed endometrial echo of 4.8 mm and an echogenic foci within the fundus.  Left ovary was not visualized.  Right ovary measured 3.2 x 2.2 x 2.2 cm.  Right adnexal hypoechoic solid-appearing area measured 3.7 x 2.2 x 2.9 cm.  CA 125 subsequently measured 9.  A follow-up ultrasound last month showed the ovary appearing somewhat lobulated measuring 64 x 26 x 29 mm with Doppler flow solid in appearance.  Left ovary was atrophic in appearance.  Cul-de-sac negative for fluid.  Endometrium had a cystic solid area with flow measuring 21 x 9 mm consistent with endometrial polyp.  The patient is admitted for laparoscopic bilateral salpingo-oophorectomy and hysteroscopic resection of the endometrial polyp.  Past Medical History:  Diagnosis Date  . Crohn disease ([place])    small bowel   . GERD (gastroesophageal reflux disease)   . Kidney stones   . UTI (urinary tract infection)     Past Surgical History:  Procedure Laterality Date  . KIDNEY SURGERY    . MICROLARYNGOSCOPY WITH CO2 LASER AND EXCISION OF VOCAL CORD LESION  2004   nodules removed  . NASAL SINUS SURGERY  2010    Family History  Problem Relation Age of Onset  . Breast cancer Mother 28  . Clotting disorder Mother   . Diabetes Mother   . Liver disease Mother   . Bladder Cancer Father   . Colon polyps Brother     Social History:  reports that she has quit smoking. She has never used smokeless tobacco. She reports that she does not drink alcohol or use drugs.  Allergies:  Allergies  Allergen Reactions  . Codeine Nausea And Vomiting    Medications: See epic for most  current list  ROS:  Was performed and pertinent positives and negatives are included in the history of present illness.  Exam: [name] assistant    Vitals:   06/28/17 1222  BP: 114/70   General appearance:  Normal HEENT normal Lungs clear Cardiac regular rate without rubs murmurs or gallops Abdomen soft nontender without masses guarding rebound Pelvic external BUS vagina with atrophic changes.  Cervix with atrophic changes.  Uterus grossly normal midline mobile nontender.  Adnexa without gross masses or tenderness.   Assessment/Plan:  62 y.o. G1P1 with history of right ovarian solid-appearing area with Doppler flow measuring 64 x 26 x 29 mm and a cystic solid endometrial area measuring 21 x 9 mm with color flow consistent with polyp for laparoscopic BSO and hysteroscopy D&C with resection of the endometrial polyp.  I reviewed the proposed surgery with the patient and her husband to include the expected intraoperative and postoperative courses as well as the recovery period.  We discussed various pathology possibilities for both of these areas to include benign and malignant.  She understands that we will attempt to remove both ovaries and fallopian tubes.  If the area does not appear to be ovarian then she also understands that I may call in a consultant such as a general surgeon which may lead to a different type of surgery or more extensive surgery.  She also understands that if it appears that significant surgery  is required to remove the mass that we may stop with a diagnostic laparoscopy and reschedule a more appropriate surgery either with robotics or a gynecologic oncologist.  We also discussed about proceeding with a hysterectomy at the same time but if BSO and hysteroscopic resection easily accomplished then risks/recovery would be better with a more limited procedure.  The expected intraoperative and postoperative courses were reviewed for both procedures to include  instrumentation, trocar placement, use of sharp blunt dissection, electrocautery, harmonic scalpel, hysteroscopy and the D&C portion.  The risks of infection, prolonged antibiotics, reoperation for abscess or hematoma formation was discussed. The risks of hemorrhage necessitating transfusion and the risks of transfusion reaction, hepatitis, HIV, mad cow disease and other unknown entities was also discussed. Incisional complications to include opening and draining of incisions and closure by secondary intention, dehiscence and long-term issues of keloid/cosmetics and hernia formation were reviewed. The risk of inadvertent injury to internal organs including vagina, cervix, uterus with uterine perforation, bowel, bladder, ureters, vessels, nerves either immediately recognized or delay recognized necessitating major exploratory reparative surgeries and future reparative surgeries including bowel resection, ostomy formation, bladder repair, ureteral damage repair was discussed with her. The patient's questions were answered to her satisfaction and she is ready to proceed with surgery.    Anastasio Auerbach MD, 1:05 PM 06/28/2017

## 2017-07-05 ENCOUNTER — Other Ambulatory Visit: Payer: Self-pay

## 2017-07-05 ENCOUNTER — Encounter (HOSPITAL_BASED_OUTPATIENT_CLINIC_OR_DEPARTMENT_OTHER): Payer: Self-pay | Admitting: *Deleted

## 2017-07-05 NOTE — Progress Notes (Signed)
SPOKE W/ PT VIA PHONE FOR PRE-OP INTERVIEW.  NPO AFTER MN.  ARRIVE AT 0700.  GETTING  CBC AND CMET DONE

## 2017-07-07 ENCOUNTER — Telehealth: Payer: Self-pay

## 2017-07-07 NOTE — Telephone Encounter (Signed)
Patient called. She is scheduled for surgery on Tuesday and has misplaced her bowel prep instructions. We reviewed them over the phone and she asked me to fax the instruction sheet to her. It was faxed.

## 2017-07-08 ENCOUNTER — Encounter (HOSPITAL_COMMUNITY)
Admission: RE | Admit: 2017-07-08 | Discharge: 2017-07-08 | Disposition: A | Discharge status: Home / Self Care | Payer: BLUE CROSS/BLUE SHIELD | Source: Ambulatory Visit | Attending: Gynecology | Admitting: Gynecology

## 2017-07-08 DIAGNOSIS — N839 Noninflammatory disorder of ovary, fallopian tube and broad ligament, unspecified: Secondary | ICD-10-CM | POA: Insufficient documentation

## 2017-07-08 DIAGNOSIS — N840 Polyp of corpus uteri: Secondary | ICD-10-CM | POA: Diagnosis present

## 2017-07-08 LAB — COMPREHENSIVE METABOLIC PANEL
ALT: 11 U/L — ABNORMAL LOW (ref 14–54)
ANION GAP: 8 (ref 5–15)
AST: 22 U/L (ref 15–41)
Albumin: 4.2 g/dL (ref 3.5–5.0)
Alkaline Phosphatase: 41 U/L (ref 38–126)
BILIRUBIN TOTAL: 0.9 mg/dL (ref 0.3–1.2)
BUN: 16 mg/dL (ref 6–20)
CALCIUM: 8.9 mg/dL (ref 8.9–10.3)
CO2: 26 mmol/L (ref 22–32)
CREATININE: 1.01 mg/dL — AB (ref 0.44–1.00)
Chloride: 105 mmol/L (ref 101–111)
GFR, EST NON AFRICAN AMERICAN: 59 mL/min — AB (ref >60)
Glucose, Bld: 85 mg/dL (ref 65–99)
Potassium: 4.3 mmol/L (ref 3.5–5.1)
Sodium: 139 mmol/L (ref 135–145)
TOTAL PROTEIN: 7.4 g/dL (ref 6.5–8.1)

## 2017-07-08 LAB — CBC
HEMATOCRIT: 38.3 % (ref 36.0–46.0)
Hemoglobin: 12.9 g/dL (ref 12.0–15.0)
MCH: 30.9 pg (ref 26.0–34.0)
MCHC: 33.7 g/dL (ref 30.0–36.0)
MCV: 91.6 fL (ref 78.0–100.0)
PLATELETS: 217 10*3/uL (ref 150–400)
RBC: 4.18 MIL/uL (ref 3.87–5.11)
RDW: 12.2 % (ref 11.5–15.5)
WBC: 4.9 10*3/uL (ref 4.0–10.5)

## 2017-07-12 ENCOUNTER — Ambulatory Visit (HOSPITAL_BASED_OUTPATIENT_CLINIC_OR_DEPARTMENT_OTHER)
Admission: RE | Admit: 2017-07-12 | Discharge: 2017-07-12 | Disposition: A | Discharge status: Home / Self Care | Payer: BLUE CROSS/BLUE SHIELD | Source: Ambulatory Visit | Attending: Gynecology | Admitting: Gynecology

## 2017-07-12 ENCOUNTER — Encounter (HOSPITAL_BASED_OUTPATIENT_CLINIC_OR_DEPARTMENT_OTHER): Payer: Self-pay

## 2017-07-12 ENCOUNTER — Ambulatory Visit (HOSPITAL_BASED_OUTPATIENT_CLINIC_OR_DEPARTMENT_OTHER): Payer: BLUE CROSS/BLUE SHIELD | Admitting: Anesthesiology

## 2017-07-12 ENCOUNTER — Encounter (HOSPITAL_BASED_OUTPATIENT_CLINIC_OR_DEPARTMENT_OTHER)
Admission: RE | Disposition: A | Discharge status: Home / Self Care | Payer: Self-pay | Source: Ambulatory Visit | Attending: Gynecology

## 2017-07-12 DIAGNOSIS — Z87440 Personal history of urinary (tract) infections: Secondary | ICD-10-CM | POA: Diagnosis not present

## 2017-07-12 DIAGNOSIS — Z885 Allergy status to narcotic agent status: Secondary | ICD-10-CM | POA: Insufficient documentation

## 2017-07-12 DIAGNOSIS — K219 Gastro-esophageal reflux disease without esophagitis: Secondary | ICD-10-CM | POA: Insufficient documentation

## 2017-07-12 DIAGNOSIS — Z79899 Other long term (current) drug therapy: Secondary | ICD-10-CM | POA: Insufficient documentation

## 2017-07-12 DIAGNOSIS — N840 Polyp of corpus uteri: Secondary | ICD-10-CM | POA: Insufficient documentation

## 2017-07-12 DIAGNOSIS — N839 Noninflammatory disorder of ovary, fallopian tube and broad ligament, unspecified: Secondary | ICD-10-CM | POA: Insufficient documentation

## 2017-07-12 DIAGNOSIS — Z87891 Personal history of nicotine dependence: Secondary | ICD-10-CM | POA: Insufficient documentation

## 2017-07-12 DIAGNOSIS — M1990 Unspecified osteoarthritis, unspecified site: Secondary | ICD-10-CM | POA: Insufficient documentation

## 2017-07-12 DIAGNOSIS — K5090 Crohn's disease, unspecified, without complications: Secondary | ICD-10-CM | POA: Insufficient documentation

## 2017-07-12 DIAGNOSIS — D219 Benign neoplasm of connective and other soft tissue, unspecified: Secondary | ICD-10-CM | POA: Diagnosis not present

## 2017-07-12 HISTORY — DX: Other seasonal allergic rhinitis: J30.2

## 2017-07-12 HISTORY — DX: Polyp of corpus uteri: N84.0

## 2017-07-12 HISTORY — PX: DILATATION & CURETTAGE/HYSTEROSCOPY WITH MYOSURE: SHX6511

## 2017-07-12 HISTORY — DX: Other specified postprocedural states: Z98.890

## 2017-07-12 HISTORY — DX: Personal history of peptic ulcer disease: Z87.11

## 2017-07-12 HISTORY — DX: Personal history of urinary calculi: Z87.442

## 2017-07-12 HISTORY — DX: Crohn's disease of small intestine without complications: K50.00

## 2017-07-12 HISTORY — DX: Other specified postprocedural states: R11.2

## 2017-07-12 HISTORY — PX: LAPAROSCOPIC BILATERAL SALPINGO OOPHERECTOMY: SHX5890

## 2017-07-12 HISTORY — DX: Personal history of other diseases of the digestive system: Z87.19

## 2017-07-12 HISTORY — DX: Unspecified osteoarthritis, unspecified site: M19.90

## 2017-07-12 HISTORY — DX: Other noninflammatory disorders of ovary, fallopian tube and broad ligament: N83.8

## 2017-07-12 SURGERY — SALPINGO-OOPHORECTOMY, BILATERAL, LAPAROSCOPIC
Anesthesia: General | Site: Abdomen

## 2017-07-12 MED ORDER — ONDANSETRON HCL 4 MG PO TABS: 4.0000 mg | ORAL_TABLET | Freq: Three times a day (TID) | ORAL | 0 refills | Status: DC | PRN

## 2017-07-12 MED ORDER — FENTANYL CITRATE (PF) 100 MCG/2ML IJ SOLN
INTRAMUSCULAR | Status: DC | PRN
  Administered 2017-07-12 (×4): 50 ug via INTRAVENOUS

## 2017-07-12 MED ORDER — OXYCODONE HCL 5 MG/5ML PO SOLN
5.0000 mg | Freq: Once | ORAL | Status: DC | PRN
  Filled 2017-07-12: qty 5

## 2017-07-12 MED ORDER — ROCURONIUM BROMIDE 50 MG/5ML IV SOSY
PREFILLED_SYRINGE | INTRAVENOUS | Status: DC | PRN
  Administered 2017-07-12: 50 mg via INTRAVENOUS
  Administered 2017-07-12: 20 mg via INTRAVENOUS

## 2017-07-12 MED ORDER — MIDAZOLAM HCL 5 MG/5ML IJ SOLN
INTRAMUSCULAR | Status: DC | PRN
  Administered 2017-07-12: 2 mg via INTRAVENOUS

## 2017-07-12 MED ORDER — ROCURONIUM BROMIDE 10 MG/ML (PF) SYRINGE
PREFILLED_SYRINGE | INTRAVENOUS | Status: AC
  Filled 2017-07-12: qty 5

## 2017-07-12 MED ORDER — LIDOCAINE 2% (20 MG/ML) 5 ML SYRINGE
INTRAMUSCULAR | Status: AC
  Filled 2017-07-12: qty 5

## 2017-07-12 MED ORDER — LACTATED RINGERS IV SOLN
INTRAVENOUS | Status: DC
  Administered 2017-07-12 (×2): via INTRAVENOUS
  Filled 2017-07-12: qty 1000

## 2017-07-12 MED ORDER — FENTANYL CITRATE (PF) 100 MCG/2ML IJ SOLN
INTRAMUSCULAR | Status: AC
  Filled 2017-07-12: qty 2

## 2017-07-12 MED ORDER — PROPOFOL 10 MG/ML IV BOLUS
INTRAVENOUS | Status: AC
  Filled 2017-07-12: qty 20

## 2017-07-12 MED ORDER — ACETAMINOPHEN 500 MG PO TABS
1000.0000 mg | ORAL_TABLET | Freq: Four times a day (QID) | ORAL | Status: DC | PRN
  Administered 2017-07-12: 1000 mg via ORAL
  Filled 2017-07-12: qty 2

## 2017-07-12 MED ORDER — OXYCODONE HCL 5 MG PO TABS
5.0000 mg | ORAL_TABLET | Freq: Once | ORAL | Status: DC | PRN
  Filled 2017-07-12: qty 1

## 2017-07-12 MED ORDER — SODIUM CHLORIDE 0.9 % IV SOLN
2.0000 g | INTRAVENOUS | Status: AC
  Administered 2017-07-12: 2 g via INTRAVENOUS
  Filled 2017-07-12: qty 2

## 2017-07-12 MED ORDER — PROPOFOL 10 MG/ML IV BOLUS
INTRAVENOUS | Status: DC | PRN
  Administered 2017-07-12: 180 mg via INTRAVENOUS

## 2017-07-12 MED ORDER — DEXAMETHASONE SODIUM PHOSPHATE 10 MG/ML IJ SOLN
INTRAMUSCULAR | Status: AC
  Filled 2017-07-12: qty 1

## 2017-07-12 MED ORDER — ACETAMINOPHEN 500 MG PO TABS
ORAL_TABLET | ORAL | Status: AC
  Filled 2017-07-12: qty 2

## 2017-07-12 MED ORDER — MIDAZOLAM HCL 2 MG/2ML IJ SOLN
INTRAMUSCULAR | Status: AC
  Filled 2017-07-12: qty 2

## 2017-07-12 MED ORDER — PROMETHAZINE HCL 25 MG/ML IJ SOLN
6.2500 mg | INTRAMUSCULAR | Status: DC | PRN
  Filled 2017-07-12: qty 1

## 2017-07-12 MED ORDER — CELECOXIB 200 MG PO CAPS
ORAL_CAPSULE | ORAL | Status: AC
  Filled 2017-07-12: qty 1

## 2017-07-12 MED ORDER — GABAPENTIN 300 MG PO CAPS
300.0000 mg | ORAL_CAPSULE | Freq: Once | ORAL | Status: AC
  Administered 2017-07-12: 300 mg via ORAL
  Filled 2017-07-12: qty 1

## 2017-07-12 MED ORDER — SUGAMMADEX SODIUM 200 MG/2ML IV SOLN
INTRAVENOUS | Status: AC
  Filled 2017-07-12: qty 2

## 2017-07-12 MED ORDER — ONDANSETRON HCL 4 MG/2ML IJ SOLN
INTRAMUSCULAR | Status: DC | PRN
  Administered 2017-07-12: 4 mg via INTRAVENOUS

## 2017-07-12 MED ORDER — SCOPOLAMINE 1 MG/3DAYS TD PT72
MEDICATED_PATCH | TRANSDERMAL | Status: AC
  Filled 2017-07-12: qty 1

## 2017-07-12 MED ORDER — PROPOFOL 500 MG/50ML IV EMUL
INTRAVENOUS | Status: AC
  Filled 2017-07-12: qty 50

## 2017-07-12 MED ORDER — GABAPENTIN 300 MG PO CAPS
ORAL_CAPSULE | ORAL | Status: AC
  Filled 2017-07-12: qty 1

## 2017-07-12 MED ORDER — SUGAMMADEX SODIUM 200 MG/2ML IV SOLN
INTRAVENOUS | Status: DC | PRN
  Administered 2017-07-12: 150 mg via INTRAVENOUS

## 2017-07-12 MED ORDER — ONDANSETRON HCL 4 MG/2ML IJ SOLN
INTRAMUSCULAR | Status: AC
  Filled 2017-07-12: qty 2

## 2017-07-12 MED ORDER — SODIUM CHLORIDE 0.9 % IR SOLN
Status: DC | PRN
  Administered 2017-07-12: 6000 mL

## 2017-07-12 MED ORDER — LIDOCAINE 2% (20 MG/ML) 5 ML SYRINGE
INTRAMUSCULAR | Status: DC | PRN
  Administered 2017-07-12: 100 mg via INTRAVENOUS

## 2017-07-12 MED ORDER — SCOPOLAMINE 1 MG/3DAYS TD PT72
MEDICATED_PATCH | TRANSDERMAL | Status: DC | PRN
  Administered 2017-07-12: 1 via TRANSDERMAL

## 2017-07-12 MED ORDER — OXYCODONE-ACETAMINOPHEN 5-325 MG PO TABS: 1.0000 | ORAL_TABLET | ORAL | 0 refills | Status: DC | PRN

## 2017-07-12 MED ORDER — BUPIVACAINE HCL 0.25 % IJ SOLN
INTRAMUSCULAR | Status: DC | PRN
  Administered 2017-07-12: 8 mL

## 2017-07-12 MED ORDER — FENTANYL CITRATE (PF) 250 MCG/5ML IJ SOLN
INTRAMUSCULAR | Status: AC
Start: 2017-07-12 — End: ?
  Filled 2017-07-12: qty 5

## 2017-07-12 MED ORDER — FENTANYL CITRATE (PF) 100 MCG/2ML IJ SOLN
25.0000 ug | INTRAMUSCULAR | Status: DC | PRN
  Administered 2017-07-12: 50 ug via INTRAVENOUS
  Filled 2017-07-12: qty 1

## 2017-07-12 MED ORDER — KETOROLAC TROMETHAMINE 30 MG/ML IJ SOLN
INTRAMUSCULAR | Status: AC
  Filled 2017-07-12: qty 1

## 2017-07-12 MED ORDER — LIDOCAINE HCL 1 % IJ SOLN
INTRAMUSCULAR | Status: DC | PRN
  Administered 2017-07-12: 10 mL

## 2017-07-12 MED ORDER — PROPOFOL 500 MG/50ML IV EMUL
INTRAVENOUS | Status: DC | PRN
  Administered 2017-07-12: 150 ug/kg/min via INTRAVENOUS

## 2017-07-12 MED ORDER — CELECOXIB 200 MG PO CAPS
200.0000 mg | ORAL_CAPSULE | Freq: Once | ORAL | Status: AC
  Administered 2017-07-12: 200 mg via ORAL
  Filled 2017-07-12: qty 1

## 2017-07-12 MED ORDER — SODIUM CHLORIDE 0.9 % IV SOLN
INTRAVENOUS | Status: AC
  Filled 2017-07-12: qty 2

## 2017-07-12 MED ORDER — DEXAMETHASONE SODIUM PHOSPHATE 10 MG/ML IJ SOLN
INTRAMUSCULAR | Status: DC | PRN
  Administered 2017-07-12: 10 mg via INTRAVENOUS

## 2017-07-12 SURGICAL SUPPLY — 66 items
APPLICATOR COTTON TIP 6IN STRL (MISCELLANEOUS) ×5 IMPLANT
BAG RETRIEVAL 10MM (BASKET) ×1
BLADE SURG 11 STRL SS (BLADE) IMPLANT
CANISTER SUCT 3000ML PPV (MISCELLANEOUS) ×5 IMPLANT
CATH ROBINSON RED A/P 16FR (CATHETERS) ×5 IMPLANT
CLOTH BEACON ORANGE TIMEOUT ST (SAFETY) ×5 IMPLANT
COUNTER NEEDLE 1200 MAGNETIC (NEEDLE) ×5 IMPLANT
DERMABOND ADVANCED (GAUZE/BANDAGES/DRESSINGS) ×2
DERMABOND ADVANCED .7 DNX12 (GAUZE/BANDAGES/DRESSINGS) ×3 IMPLANT
DEVICE MYOSURE LITE (MISCELLANEOUS) IMPLANT
DEVICE MYOSURE REACH (MISCELLANEOUS) ×3 IMPLANT
DILATOR CANAL MILEX (MISCELLANEOUS) IMPLANT
DRSG COVADERM PLUS 2X2 (GAUZE/BANDAGES/DRESSINGS) ×3 IMPLANT
DRSG OPSITE POSTOP 3X4 (GAUZE/BANDAGES/DRESSINGS) ×3 IMPLANT
DURAPREP 26ML APPLICATOR (WOUND CARE) ×5 IMPLANT
FILTER ARTHROSCOPY CONVERTOR (FILTER) ×5 IMPLANT
FILTER SMOKE EVAC LAPAROSHD (FILTER) IMPLANT
GLOVE BIO SURGEON STRL SZ 6.5 (GLOVE) ×4 IMPLANT
GLOVE BIO SURGEON STRL SZ7.5 (GLOVE) ×10 IMPLANT
GLOVE BIO SURGEONS STRL SZ 6.5 (GLOVE) ×2
GLOVE BIOGEL PI IND STRL 6.5 (GLOVE) ×1 IMPLANT
GLOVE BIOGEL PI IND STRL 7.5 (GLOVE) ×2 IMPLANT
GLOVE BIOGEL PI INDICATOR 6.5 (GLOVE) ×2
GLOVE BIOGEL PI INDICATOR 7.5 (GLOVE) ×4
GOWN STRL REUS W/ TWL LRG LVL3 (GOWN DISPOSABLE) ×5 IMPLANT
GOWN STRL REUS W/ TWL XL LVL3 (GOWN DISPOSABLE) IMPLANT
GOWN STRL REUS W/TWL LRG LVL3 (GOWN DISPOSABLE) ×11 IMPLANT
GOWN STRL REUS W/TWL XL LVL3 (GOWN DISPOSABLE)
IV NS IRRIG 3000ML ARTHROMATIC (IV SOLUTION) ×8 IMPLANT
KIT TURNOVER CYSTO (KITS) ×5 IMPLANT
MYOSURE XL FIBROID REM (MISCELLANEOUS)
NDL HYPO 25X1 1.5 SAFETY (NEEDLE) ×2 IMPLANT
NDL SAFETY ECLIPSE 18X1.5 (NEEDLE) IMPLANT
NEEDLE HYPO 18GX1.5 SHARP (NEEDLE)
NEEDLE HYPO 25X1 1.5 SAFETY (NEEDLE) ×4 IMPLANT
NS IRRIG 500ML POUR BTL (IV SOLUTION) ×5 IMPLANT
PACK VAGINAL MINOR WOMEN LF (CUSTOM PROCEDURE TRAY) ×5 IMPLANT
PAD OB MATERNITY 4.3X12.25 (PERSONAL CARE ITEMS) ×5 IMPLANT
PAD PREP 24X48 CUFFED NSTRL (MISCELLANEOUS) ×5 IMPLANT
SCISSORS LAP 5X35 DISP (ENDOMECHANICALS) IMPLANT
SEAL ROD LENS SCOPE MYOSURE (ABLATOR) ×5 IMPLANT
SET IRRIG TUBING LAPAROSCOPIC (IRRIGATION / IRRIGATOR) IMPLANT
SHEARS HARMONIC ACE PLUS 45CM (MISCELLANEOUS) IMPLANT
SOLUTION ELECTROLUBE (MISCELLANEOUS) IMPLANT
SUT CHROMIC 3 0 SH 27 (SUTURE) IMPLANT
SUT MNCRL AB 3-0 PS2 27 (SUTURE) ×3 IMPLANT
SUT VICRYL 0 UR6 27IN ABS (SUTURE) ×5 IMPLANT
SUT VICRYL 4-0 PS2 18IN ABS (SUTURE) IMPLANT
SYR 3ML 23GX1 SAFETY (SYRINGE) IMPLANT
SYR CONTROL 10ML LL (SYRINGE) IMPLANT
SYRINGE 10CC LL (SYRINGE) IMPLANT
SYRINGE LUER LOK 1CC (MISCELLANEOUS) IMPLANT
SYS BAG RETRIEVAL 10MM (BASKET) ×3
SYSTEM BAG RETRIEVAL 10MM (BASKET) ×1 IMPLANT
SYSTEM TISS REMOVAL MYSR XL RM (MISCELLANEOUS) IMPLANT
TOWEL OR 17X24 6PK STRL BLUE (TOWEL DISPOSABLE) ×10 IMPLANT
TRAY FOLEY CATH SILVER 14FR (SET/KITS/TRAYS/PACK) ×3 IMPLANT
TRAY LAPAROSCOPIC (CUSTOM PROCEDURE TRAY) ×5 IMPLANT
TROCAR XCEL NON-BLD 11X100MML (ENDOMECHANICALS) ×5 IMPLANT
TROCAR XCEL NON-BLD 5MMX100MML (ENDOMECHANICALS) ×5 IMPLANT
TUBE CONNECTING 12'X1/4 (SUCTIONS)
TUBE CONNECTING 12X1/4 (SUCTIONS) IMPLANT
TUBING AQUILEX INFLOW (TUBING) ×5 IMPLANT
TUBING AQUILEX OUTFLOW (TUBING) ×5 IMPLANT
TUBING INSUF HEATED (TUBING) ×5 IMPLANT
WATER STERILE IRR 500ML POUR (IV SOLUTION) IMPLANT

## 2017-07-12 NOTE — Transfer of Care (Signed)
Immediate Anesthesia Transfer of Care Note  Patient: Alyssa Strong  Procedure(s) Performed: LAPAROSCOPIC BILATERAL SALPINGO OOPHORECTOMY, RIGHT OVARIAN CYSTECTOMY, PELVIC WASHINGS (Bilateral Abdomen) DILATATION & CURETTAGE/HYSTEROSCOPY WITH MYOSURE (N/A Vagina )  Patient Location: PACU  Anesthesia Type:General  Level of Consciousness: drowsy  Airway & Oxygen Therapy: Patient Spontanous Breathing and Patient connected to nasal cannula oxygen  Post-op Assessment: Report given to RN  Post vital signs: Reviewed and stable  Last Vitals:  Vitals Value Taken Time  BP 111/89 07/12/2017 10:36 AM  Temp    Pulse 49 07/12/2017 10:40 AM  Resp 12 07/12/2017 10:40 AM  SpO2 100 % 07/12/2017 10:40 AM  Vitals shown include unvalidated device data.  Last Pain:  Vitals:   07/12/17 0749  TempSrc:   PainSc: 0-No pain      Patients Stated Pain Goal: 5 (47/18/55 0158)  Complications: No apparent anesthesia complications

## 2017-07-12 NOTE — Anesthesia Preprocedure Evaluation (Signed)
Anesthesia Evaluation  Patient identified by MRN, date of birth, ID band Patient awake    Reviewed: Allergy & Precautions, NPO status , Patient's Chart, lab work & pertinent test results  History of Anesthesia Complications (+) PONV  Airway Mallampati: II  TM Distance: >3 FB Neck ROM: Full    Dental  (+) Dental Advisory Given   Pulmonary former smoker,    breath sounds clear to auscultation       Cardiovascular negative cardio ROS   Rhythm:Regular Rate:Normal     Neuro/Psych negative neurological ROS  negative psych ROS   GI/Hepatic Neg liver ROS, PUD, GERD  Controlled and Medicated,Crohn's disease   Endo/Other  negative endocrine ROS  Renal/GU Nephrolithiasis  negative genitourinary   Musculoskeletal  (+) Arthritis ,   Abdominal   Peds  Hematology negative hematology ROS (+)   Anesthesia Other Findings Hx vocal cord lesion s/p excision  Reproductive/Obstetrics Ovarian mass                             Anesthesia Physical Anesthesia Plan  ASA: II  Anesthesia Plan: General   Post-op Pain Management:    Induction: Intravenous  PONV Risk Score and Plan: 4 or greater and Treatment may vary due to age or medical condition, TIVA, Ondansetron and Midazolam  Airway Management Planned: Oral ETT  Additional Equipment: None  Intra-op Plan:   Post-operative Plan: Extubation in OR  Informed Consent: I have reviewed the patients History and Physical, chart, labs and discussed the procedure including the risks, benefits and alternatives for the proposed anesthesia with the patient or authorized representative who has indicated his/her understanding and acceptance.   Dental advisory given  Plan Discussed with: CRNA and Anesthesiologist  Anesthesia Plan Comments:         Anesthesia Quick Evaluation

## 2017-07-12 NOTE — Discharge Instructions (Signed)
Postoperative Instructions Laparoscopy  Dr. Phineas Real and the nursing staff have discussed postoperative instructions with you.  If you have any questions please ask them before you leave the hospital, or call Dr Elisabeth Most office at [phone].    We would like to emphasize the following instructions:   ? Call the office to make your follow-up appointment as recommended by Dr Phineas Real (usually 1-2 weeks).  ? You were given a prescription, or one was ordered for you at the pharmacy you designated.  Get that prescription filled and take the medication according to instructions.  ? You may eat a regular diet, but slowly until you start having bowel movements.  ? Drink plenty of water daily.  ? Nothing in the vagina (intercourse, douching, objects of any kind) for 2 weeks.  When reinitiating intercourse, if it is uncomfortable, stop and make an appointment with Dr Phineas Real to be evaluated.  ? No driving for several days until the anesthesia has worn off and you are not having significant pain.  Car rides (short) are ok, as long as you are not having significant pain, but no traveling out of town until your postoperative appointment.  ? You may shower, but no baths for two weeks.  Walking up and down stairs is ok.  No heavy lifting, prolonged standing, repeated bending or any working out until your first  postoperative appointment.  ? Rest frequently, listen to your body and do not push yourself and overdo it.  ? Call if:  o Your pain medication does not seem strong enough. o Worsening pain or abdominal bloating o Persistent nausea or vomiting o Difficulty with urination or bowel movements. o Temperature of 101 degrees or higher. o Heavy vaginal bleeding.  If your period is due, you may use tampons.   o Incisions become red, tender or begin to drain. o You have any questions or concerns  DISCHARGE INSTRUCTIONS: D&C The following instructions have been prepared to help you care for  yourself upon your return home.   Personal hygiene:  Use sanitary pads for vaginal drainage, not tampons.  Shower the day after your procedure.  NO tub baths, pools or Jacuzzis for 2-3 weeks.  Wipe front to back after using the bathroom.  Activity and limitations:  Do NOT drive or operate any equipment for 24 hours. The effects of anesthesia are still present and drowsiness may result.  Do NOT rest in bed all day.  Walking is encouraged.  Walk up and down stairs slowly.  You may resume your normal activity in one to two days or as indicated by your physician.  Sexual activity: NO intercourse for at least 2 weeks after the procedure, or as indicated by your physician.  Diet: Eat a light meal as desired this evening. You may resume your usual diet tomorrow.  Return to work: You may resume your work activities in one to two days or as indicated by your doctor.  What to expect after your surgery: Expect to have vaginal bleeding/discharge for 2-3 days and spotting for up to 10 days. It is not unusual to have soreness for up to 1-2 weeks. You may have a slight burning sensation when you urinate for the first day. Mild cramps may continue for a couple of days. You may have a regular period in 2-6 weeks.  Call your doctor for any of the following:  Excessive vaginal bleeding, saturating and changing one pad every hour.  Inability to urinate 6 hours after discharge from hospital.  Pain not relieved by pain medication.  Fever of 100.4 F or greater.  Unusual vaginal discharge or odor.   Call for an appointment:    Patients signature: ______________________  Nurses signature ________________________  Support person's signature_______________________     Post Anesthesia Home Care Instructions  Activity: Get plenty of rest for the remainder of the day. A responsible individual must stay with you for 24 hours following the procedure.  For the next 24 hours, DO  NOT: -Drive a car -Paediatric nurse -Drink alcoholic beverages -Take any medication unless instructed by your physician -Make any legal decisions or sign important papers.  Meals: Start with liquid foods such as gelatin or soup. Progress to regular foods as tolerated. Avoid greasy, spicy, heavy foods. If nausea and/or vomiting occur, drink only clear liquids until the nausea and/or vomiting subsides. Call your physician if vomiting continues.  Special Instructions/Symptoms: Your throat may feel dry or sore from the anesthesia or the breathing tube placed in your throat during surgery. If this causes discomfort, gargle with warm salt water. The discomfort should disappear within 24 hours.  If you had a scopolamine patch placed behind your ear for the management of post- operative nausea and/or vomiting:  1. The medication in the patch is effective for 72 hours, after which it should be removed.  Wrap patch in a tissue and discard in the trash. Wash hands thoroughly with soap and water. 2. You may remove the patch earlier than 72 hours if you experience unpleasant side effects which may include dry mouth, dizziness or visual disturbances. 3. Avoid touching the patch. Wash your hands with soap and water after contact with the patch.

## 2017-07-12 NOTE — Op Note (Signed)
Alyssa Strong 1955/11/06 [id number]   Post Operative Note   Date of surgery:  07/12/2017  Pre Op Dx: Right ovarian mass.  Endometrial polyp  Post Op Dx: Right ovarian mass.  Endometrial polyp  Procedure: Hysteroscopy, D&C, Myosure resection endometrial polyp.  Laparoscopic bilateral salpingo-oophorectomy   Surgeon:  Anastasio Auerbach  Assistant: Thomes Cake  Anesthesia:  General  EBL: 25 cc  Distended media discrepancy: 65 cc saline  Complications:  None  Specimen: #1 endometrial polyp #2 endometrial curetting #3 opening cell washing #4 ovary and fallopian tube #5 right ovarian mass and fallopian tube to pathology  Findings: EUA: External BUS vagina with atrophic changes.  Cervix with atrophic changes.  Uterus grossly normal size midline mobile.  Adnexa without gross masses   Operative: Anterior cul-de-sac normal.  Posterior cul-de-sac normal.  Uterus normal size midline mobile.  Right and left fallopian tubes normal length, caliber and fimbriated ends.  Left ovary small, atrophic, grossly normal in appearance.  Right ovary with 2 to 3 cm solid feeling mass proximally, 2 cm bosselated midportion and a 2 cm fleshy erythematous appearing distal mass.  All well contained with no evidence of papillations or excrescences externally.  Pelvis otherwise normal with smooth peritoneal surfaces and no gross evidence of lymphadenopathy.  Upper abdominal exam shows liver smooth without abnormalities or evidence of perihepatic adhesions.  Gross intestinal and omental exam were normal.  Appendix not visualized.   Hysteroscopic: Adequate noting fundus, anterior/posterior endometrial surfaces, right/left tubal ostia, lower uterine segment and endocervical canal visualized.  Broad-based endometrial polyp mid posterior endometrial surface resected to the level of the surrounding endometrium.  Procedure: The patient was taken to the operating room, placed in the low dorsal lithotomy position, underwent  general anesthesia, received an abdominal, vaginal and perineal preparation per nursing personnel and a Foley catheter was placed.  The timeout was performed by the surgical team.  An EUA was performed and a Hulka tenaculum was placed without difficulty.  The patient was draped in the usual fashion.  A vertical infraumbilical incision was made and using a 10 mm direct entry trocar the abdomen was directly entered under direct visualization without difficulty and subsequently insufflated.  Right and left 5 mm suprapubic ports were then placed under direct visualization after transillumination for the vessels without difficulty.  Examination of the pelvic organs and upper abdominal exam was carried out with findings noted above.  An opening cell washing was then obtained and sent to cytology.  The left fallopian tube was elevated with a small epiploica adhesion lysed using the harmonic scalpel and subsequently the left infundibulopelvic ligament and vessels were identified, the ureter away from the surgical site and the pedicle was transected using the harmonic scalpel.  The adnexa was then freed from its attachments using the harmonic scalpel to the level of the uterine ovarian pedicle and the fallopian tube was transected at it's insertion with the uterus and the specimen subsequently detached.  This was placed in the posterior cul-de-sac for future retrieval.  Attention was then turned to the right adnexa which was elevated, the infundibulopelvic ligament and vessels identified as was the ureter away from the surgical site and the adnexa was excised in a similar fashion to the left side.  Using a retrieval bag through the 10 mm port and a 5 mm suprapubic scope the specimens were retrieved and subsequently identified to nursing personnel for pathology.  The abdomen was reinsufflated and the pelvis was reinspected showing adequate hemostasis at all  surgical sites.  The gas was subsequently allowed to escape and all  skin incisions were injected using 0.25% Marcaine.  The right and left suprapubic ports were closed with interrupted cutaneous 3-0 Monocryl sutures.  The infraumbilical fascia was closed using 0 Vicryl suture in a running stitch and the skin subsequently closed using 3-0 Monocryl in a running subcuticular stitch.  Dermabond skin adhesive was applied to all skin incisions.  Attention was then turned to the hysteroscopic portion of the procedure.  The Hulka tenaculum was removed from the cervix and the cervix was visualized with a speculum and a paracervical block using 1% lidocaine was placed.  A single-tooth tenaculum was applied to the anterior lip of the cervix and the cervix was gently dilated to admit the Myosure hysteroscope.  Hysteroscopy was then performed with findings noted above.  Using the Reach resectoscopic wand the polyp was excised in it's entirety to the level of the surrounding endometrium.  A gentle sharp curettage was then performed and sent separately to pathology.  Repeat hysteroscopy showed an empty cavity with good distention and no evidence of perforation.  The tenaculum was then removed with adequate hemostasis visualized at the tenaculum site and external cervical os.  The specimens were identified for pathology.  The sponge, needle and instrument count were verified correct by nursing personnel.  The patient was awakened without difficulty and taken to recovery room in good condition having tolerated procedure well.     Anastasio Auerbach MD, 11:10 AM 07/12/2017

## 2017-07-12 NOTE — Anesthesia Postprocedure Evaluation (Signed)
Anesthesia Post Note  Patient: Evaluna Utke  Procedure(s) Performed: LAPAROSCOPIC BILATERAL SALPINGO OOPHORECTOMY, RIGHT OVARIAN CYSTECTOMY, PELVIC WASHINGS (Bilateral Abdomen) DILATATION & CURETTAGE/HYSTEROSCOPY WITH MYOSURE (N/A Vagina )     Patient location during evaluation: PACU Anesthesia Type: General Level of consciousness: awake and alert Pain management: pain level controlled Vital Signs Assessment: post-procedure vital signs reviewed and stable Respiratory status: spontaneous breathing, nonlabored ventilation and respiratory function stable Cardiovascular status: blood pressure returned to baseline and stable Postop Assessment: no apparent nausea or vomiting Anesthetic complications: no    Last Vitals:  Vitals:   07/12/17 1215 07/12/17 1358  BP: 106/72 123/87  Pulse: (!) 52 (!) 51  Resp: 11 14  Temp:  36.5 C  SpO2: 94% 100%    Last Pain:  Vitals:   07/12/17 1358  TempSrc: Oral  PainSc: 4                  Audry Pili

## 2017-07-12 NOTE — H&P (Signed)
  The patient was examined.  I reviewed the proposed surgery and consent form with the patient.  The dictated history and physical is current and accurate and all questions were answered. The patient is ready to proceed with surgery and has a realistic understanding and expectation for the outcome.   Anastasio Auerbach MD, 8:20 AM 07/12/2017

## 2017-07-12 NOTE — Anesthesia Procedure Notes (Signed)
Procedure Name: Intubation Date/Time: 07/12/2017 9:05 AM Performed by: Bonney Aid, CRNA Pre-anesthesia Checklist: Patient identified, Emergency Drugs available, Suction available and Patient being monitored Patient Re-evaluated:Patient Re-evaluated prior to induction Oxygen Delivery Method: Circle system utilized Preoxygenation: Pre-oxygenation with 100% oxygen Induction Type: IV induction Ventilation: Mask ventilation without difficulty Laryngoscope Size: Mac and 3 Grade View: Grade I Tube type: Oral Number of attempts: 1 Airway Equipment and Method: Stylet Placement Confirmation: ETT inserted through vocal cords under direct vision,  positive ETCO2 and breath sounds checked- equal and bilateral Secured at: 21 cm Tube secured with: Tape Dental Injury: Teeth and Oropharynx as per pre-operative assessment

## 2017-07-13 ENCOUNTER — Encounter (HOSPITAL_BASED_OUTPATIENT_CLINIC_OR_DEPARTMENT_OTHER): Payer: Self-pay | Admitting: Gynecology

## 2017-07-27 ENCOUNTER — Ambulatory Visit (INDEPENDENT_AMBULATORY_CARE_PROVIDER_SITE_OTHER): Payer: BLUE CROSS/BLUE SHIELD | Admitting: Gynecology

## 2017-07-27 ENCOUNTER — Encounter: Payer: Self-pay | Admitting: Gynecology

## 2017-07-27 VITALS — BP 118/74

## 2017-07-27 DIAGNOSIS — R300 Dysuria: Secondary | ICD-10-CM

## 2017-07-27 DIAGNOSIS — Z09 Encounter for follow-up examination after completed treatment for conditions other than malignant neoplasm: Secondary | ICD-10-CM

## 2017-07-27 NOTE — Patient Instructions (Signed)
Follow-up when due for annual exam. 

## 2017-07-27 NOTE — Progress Notes (Signed)
    Alyssa Strong 1955/06/07 [id number]        62 y.o.  G1P1 presents for her postoperative visit status post laparoscopic BSO and hysteroscopy D&C with resection of endometrial polyp.  She is done well postoperatively.  Has some mild intermittent dysuria.  No fever or chills, low back pain, urgency or significant frequency.  Past medical history,surgical history, problem list, medications, allergies, family history and social history were all reviewed and documented in the EPIC chart.  Directed ROS with pertinent positives and negatives documented in the history of present illness/assessment and plan.  Exam: [name] assistant Vitals:   07/27/17 0932  BP: 118/74   General appearance:  Normal Abdomen soft nontender without masses guarding rebound.  Incisions healing nicely. Pelvic bimanual without masses or tenderness.    Assessment/Plan:  62 y.o. G1P1 with normal postoperative visit status post laparoscopic bilateral salpingo-oophorectomy and hysteroscopic resection endometrial polyps.  I reviewed postoperative pathology which showed a benign fibroma and a benign endometrial polyp.  I also reviewed pictures from the surgery.  Patient will slowly resume all normal activities and follow-up when she is due for her annual exam.    Anastasio Auerbach MD, 10:08 AM 07/27/2017

## 2017-07-27 NOTE — Addendum Note (Signed)
Addended by: Nelva Nay on: 07/27/2017 12:08 PM   Modules accepted: Orders

## 2017-07-29 LAB — URINALYSIS, COMPLETE W/RFL CULTURE
BACTERIA UA: NONE SEEN /HPF
Bilirubin Urine: NEGATIVE
GLUCOSE, UA: NEGATIVE
Hyaline Cast: NONE SEEN /LPF
Ketones, ur: NEGATIVE
LEUKOCYTE ESTERASE: NEGATIVE
Nitrites, Initial: NEGATIVE
Protein, ur: NEGATIVE
Specific Gravity, Urine: 1.02 (ref 1.001–1.03)
pH: 7 (ref 5.0–8.0)

## 2017-07-29 LAB — URINE CULTURE
MICRO NUMBER: 90709940
SPECIMEN QUALITY:: ADEQUATE

## 2017-07-29 LAB — CULTURE INDICATED

## 2017-08-19 ENCOUNTER — Other Ambulatory Visit: Payer: Self-pay | Admitting: Gastroenterology

## 2018-04-26 ENCOUNTER — Encounter: Payer: Self-pay | Admitting: Gynecology

## 2018-04-26 ENCOUNTER — Ambulatory Visit: Payer: BLUE CROSS/BLUE SHIELD | Admitting: Gynecology

## 2018-04-26 ENCOUNTER — Other Ambulatory Visit: Payer: Self-pay

## 2018-04-26 ENCOUNTER — Telehealth: Payer: Self-pay | Admitting: *Deleted

## 2018-04-26 VITALS — BP 116/70 | Ht 63.0 in | Wt 165.0 lb

## 2018-04-26 DIAGNOSIS — Z01419 Encounter for gynecological examination (general) (routine) without abnormal findings: Secondary | ICD-10-CM

## 2018-04-26 DIAGNOSIS — N952 Postmenopausal atrophic vaginitis: Secondary | ICD-10-CM | POA: Diagnosis not present

## 2018-04-26 DIAGNOSIS — N941 Unspecified dyspareunia: Secondary | ICD-10-CM

## 2018-04-26 MED ORDER — NONFORMULARY OR COMPOUNDED ITEM: 3 refills | Status: DC

## 2018-04-26 NOTE — Progress Notes (Signed)
    Alyssa Strong 1955-08-05 [id number]        62 y.o.  G1P1 for annual gynecologic exam.  Has noticed most recently some pain with intercourse as a tearing or stabbing sensation.  Had noted after her BSO last June hot flushes and sweats for the first month or 2 and then they slowly resolved.  Past medical history,surgical history, problem list, medications, allergies, family history and social history were all reviewed and documented as reviewed in the EPIC chart.  ROS:  Performed with pertinent positives and negatives included in the history, assessment and plan.   Additional significant findings : None   Exam: [name] assistant Vitals:   04/26/18 1037  BP: 116/70  Weight: 165 lb (74.8 kg)  Height: 5\' 3"  (1.6 m)   Body mass index is 29.23 kg/m.  General appearance:  Normal affect, orientation and appearance. Skin: Grossly normal HEENT: Without gross lesions.  No cervical or supraclavicular adenopathy. Thyroid normal.  Lungs:  Clear without wheezing, rales or rhonchi Cardiac: RR, without RMG Abdominal:  Soft, nontender, without masses, guarding, rebound, organomegaly or hernia Breasts:  Examined lying and sitting without masses, retractions, discharge or axillary adenopathy. Pelvic:  Ext, BUS, Vagina: With atrophic changes  Cervix: With atrophic changes  Uterus: Anteverted, normal size, shape and contour, midline and mobile nontender   Adnexa: Without masses or tenderness    Anus and perineum: Normal   Rectovaginal: Normal sphincter tone without palpated masses or tenderness.    Assessment/Plan:  63 y.o. G1P1 female for annual gynecologic exam.   1. Dyspareunia.  I suspect secondary to atrophic changes.  It sounds as if she had some ovarian estrogen production as she did have hot flushes and sweats after her BSO.  I think now she is developing more vaginal atrophy that is becoming uncomfortable.  We discussed options for this to include vaginal estrogen.  Various  formulations discussed.  Potential absorption with systemic effects to include thrombosis, breast stimulation and endometrial stimulation reviewed.  Patient would like to go ahead and try this and elects for formulated estradiol cream through [facility] twice weekly.  We will follow-up if it continues to be an issue. 2. Mammography due now she is going to call and schedule.  Breast exam normal today. 3. Colonoscopy 2018.  Repeat at their recommended interval. 4. DEXA reported 2018.  I do not have a copy of this results but she reports it was normal. 5. Pap smear reported 2019.  I do not have copies of these results but she reports it was normal.  Plan repeat Pap smear at 3-year interval. 6. Health maintenance.  No routine lab work done as this is done elsewhere.  Follow-up 1 year, sooner as needed.   Anastasio Auerbach MD, 11:03 AM 04/26/2018

## 2018-04-26 NOTE — Telephone Encounter (Signed)
Rx called in 

## 2018-04-26 NOTE — Telephone Encounter (Signed)
-----   Message from Anastasio Auerbach, MD sent at 04/26/2018 11:05 AM EDT ----- Call into [facility] formulated prefilled estradiol vaginal syringes 1-monthsupply use twice weekly refill x1 year

## 2018-04-26 NOTE — Patient Instructions (Signed)
Office will call in a prescription for the vaginal estrogen cream at [facility].  Use the cream twice weekly.  Schedule your mammogram at the breast center  Call to Schedule your mammogram  [facility]. [address] [place] Phone: [phone]

## 2018-10-04 ENCOUNTER — Telehealth: Payer: Self-pay | Admitting: Gastroenterology

## 2018-10-05 ENCOUNTER — Telehealth: Payer: Self-pay

## 2018-10-05 ENCOUNTER — Other Ambulatory Visit: Payer: Self-pay

## 2018-10-05 MED ORDER — BUDESONIDE 3 MG PO CPEP: 3.0000 mg | ORAL_CAPSULE | Freq: Every day | ORAL | 0 refills | Status: DC

## 2018-10-05 MED ORDER — BUDESONIDE 3 MG PO CPEP: 9.0000 mg | ORAL_CAPSULE | Freq: Every day | ORAL | 2 refills | Status: DC

## 2018-10-05 NOTE — Telephone Encounter (Signed)
Left message on patient's voice mail to please call back if she is interested in the 4:00pm office visit slot that opened up today for Dr. Havery Moros

## 2018-10-05 NOTE — Telephone Encounter (Signed)
Patient scheduled for In Person office visit 11/13/18 at 10:30am. On call list if something opens up sooner. Sent order for Budesonide to patient's pharmacy. Instructions given on the phone for tapering.

## 2018-10-05 NOTE — Telephone Encounter (Signed)
I called her yesterday before I left and she said she had gradually been getting symptoms over the last couple of months. RUQ pain,diarrhea the last 4 days, no fever. I didn't send it to you late yesterday, because I wanted to check with Barbera Setters this morning and see if she could open up an office visit slot for you this afternoon, since you said your [facility] wasn't full. The patient hadn't seen you since 04/2017, so I thought that would be the first choice. Order sent to her Pharmacy for the Budesonide. I'll touch base with you after I talk to [facility]. She Is at [facility] this am.

## 2018-10-05 NOTE — Telephone Encounter (Signed)
Looks like I had a cancellation at 4 PM today if the patient wants to be seen today, please schedule her. Thanks

## 2018-10-05 NOTE — Telephone Encounter (Signed)
Alyssa Strong can you touch base with this patient and see how she is doing. If symptoms are managable please prescribe her budesonide 62m / day for 4 weeks, then 655m/ day for 2 weeks, and then 62m29m day for 2 weeks and then done. I would also like to see her in clinic for a follow up in the next month or so. If this does not help or worsening symptoms, or if she is in distress, please let me know. Thanks

## 2018-11-13 ENCOUNTER — Other Ambulatory Visit (INDEPENDENT_AMBULATORY_CARE_PROVIDER_SITE_OTHER): Payer: BC Managed Care – PPO

## 2018-11-13 ENCOUNTER — Encounter

## 2018-11-13 ENCOUNTER — Encounter: Payer: Self-pay | Admitting: Gastroenterology

## 2018-11-13 ENCOUNTER — Ambulatory Visit: Payer: BC Managed Care – PPO | Admitting: Gastroenterology

## 2018-11-13 VITALS — BP 122/84 | HR 70 | Temp 98.5°F | Ht 64.5 in | Wt 168.0 lb

## 2018-11-13 DIAGNOSIS — K5000 Crohn's disease of small intestine without complications: Secondary | ICD-10-CM

## 2018-11-13 DIAGNOSIS — R194 Change in bowel habit: Secondary | ICD-10-CM

## 2018-11-13 DIAGNOSIS — R109 Unspecified abdominal pain: Secondary | ICD-10-CM

## 2018-11-13 DIAGNOSIS — G8929 Other chronic pain: Secondary | ICD-10-CM | POA: Diagnosis not present

## 2018-11-13 LAB — COMPREHENSIVE METABOLIC PANEL
ALT: 10 U/L (ref 0–35)
AST: 16 U/L (ref 0–37)
Albumin: 4.5 g/dL (ref 3.5–5.2)
Alkaline Phosphatase: 47 U/L (ref 39–117)
BUN: 16 mg/dL (ref 6–23)
CO2: 29 mEq/L (ref 19–32)
Calcium: 9.8 mg/dL (ref 8.4–10.5)
Chloride: 104 mEq/L (ref 96–112)
Creatinine, Ser: 0.99 mg/dL (ref 0.40–1.20)
GFR: 56.83 mL/min — ABNORMAL LOW (ref >60.00)
Glucose, Bld: 96 mg/dL (ref 70–99)
Potassium: 4.4 mEq/L (ref 3.5–5.1)
Sodium: 140 mEq/L (ref 135–145)
Total Bilirubin: 0.7 mg/dL (ref 0.2–1.2)
Total Protein: 7.5 g/dL (ref 6.0–8.3)

## 2018-11-13 LAB — CBC WITH DIFFERENTIAL/PLATELET
Basophils Absolute: 0 10*3/uL (ref 0.0–0.1)
Basophils Relative: 1.3 % (ref 0.0–3.0)
Eosinophils Absolute: 0 10*3/uL (ref 0.0–0.7)
Eosinophils Relative: 1.2 % (ref 0.0–5.0)
HCT: 39.8 % (ref 36.0–46.0)
Hemoglobin: 13.5 g/dL (ref 12.0–15.0)
Lymphocytes Relative: 36.2 % (ref 12.0–46.0)
Lymphs Abs: 1.4 10*3/uL (ref 0.7–4.0)
MCHC: 33.9 g/dL (ref 30.0–36.0)
MCV: 91 fl (ref 78.0–100.0)
Monocytes Absolute: 0.3 10*3/uL (ref 0.1–1.0)
Monocytes Relative: 6.6 % (ref 3.0–12.0)
Neutro Abs: 2.1 10*3/uL (ref 1.4–7.7)
Neutrophils Relative %: 54.7 % (ref 43.0–77.0)
Platelets: 207 10*3/uL (ref 150.0–400.0)
RBC: 4.37 Mil/uL (ref 3.87–5.11)
RDW: 13.5 % (ref 11.5–15.5)
WBC: 3.9 10*3/uL — ABNORMAL LOW (ref 4.0–10.5)

## 2018-11-13 LAB — SEDIMENTATION RATE: Sed Rate: 11 mm/hr (ref 0–30)

## 2018-11-13 LAB — C-REACTIVE PROTEIN: CRP: <1 mg/dL (ref 0.5–20.0)

## 2018-11-13 MED ORDER — DICYCLOMINE HCL 10 MG PO CAPS: 10.0000 mg | ORAL_CAPSULE | Freq: Three times a day (TID) | ORAL | 3 refills | Status: DC | PRN

## 2018-11-13 NOTE — Progress Notes (Signed)
HPI :  63 year old female here for follow-up visit  Crohn's history: Diagnosed late 2012 with colonoscopy with erosions in the terminal ileum with biopsy showing granulomas. She had a Capsule study late 2012 with findings of distal ileal ulcerations, erosions and colitis. He had a good response to steroids initially. Eventually changed to 6-MP with good results.Stopped it and had a flare, took again for a period of time and had stopped it when she represented to our office in 11/2016.  Colonoscopy 12/2016 with 2 small erosions in TI but biopsies normal. Capsule endoscopy with mild small bowel Crohn's disease. She was asymptomatic at the time and she elected to monitor  SINCE LAST VISIT  Patient here for follow-up visit.  She states in general she had been doing pretty well since her last visit.  We had a CT enterography performed in March 2019 which did not show any active small bowel Crohn's disease.  She had a suggestion of a fullness in the right ovary which led to a pelvic ultrasound showing a suspected ovarian mass.  She was then referred to gynecology and had resection which she was told was benign.  She has been off all therapy for Crohn's disease and had been doing well until May of this year at which time she reported she was having some loose stools which started bothering her.  She states was diagnosed with coronavirus in June unfortunately which she recovered from well.  In July she developed some abdominal pain which has been bothering her, she was concerned related to her Crohn's disease.  I gave her a budesonide taper starting with 9 mg a day for 4 weeks and then transitioning to 6 mg a day for 2 weeks, then 3 mg a day for 2 weeks, then off.  She states the budesonide has helped to reduce her loose stools.  Currently she is having 1 bowel movement today however it remains loose with some urgency.  Previously her frequency was much higher.  She states she continues to have abdominal  discomfort that bothers her.  Pain is located in the right mid abdomen that radiates into the right flank.  She thinks at this point it tends to be there all the time and never really goes away.  She is not sure if this feels like her prior Crohn's symptoms.  She denies any nausea or vomiting.  She denies any postprandial symptoms.  Eating does not reproduce this pain.  Her pain does not appear to be related to her bowel movements.  She is denied any fevers.  She has not had any known  Crohn's complications in the past. She is not using any NSAIDs.  Historically she has wanted to avoid treatment with biologic therapy for her Crohn's   She questions a prior ultrasound of her abdomen that was done last year showing hepatomegaly.  Her LFTs have been normal.  Her mother did have cirrhosis from fatty liver disease.  She states her reflux symptoms are well controlled on PPI.  Most recent workup: Colonoscopy 12/21/2016 - 2 small erosions in the TI, sigmoid diverticulosis, normal colon otherwise - biopsies were normal. Colon biopsies normal.   Capsule endoscopy 01/17/2017 - gastritis, 2 ulcerations seen in the distal small bowel - suggestive of mild Crohn's disease  CT enterography 04/28/2017 - no active small bowel disease, suggestion of fullness of the right ovary    Past Medical History:  Diagnosis Date   Arthritis    Crohn's ileitis ([place])  followed by dr --  mild crohn's ileitis (distal small bowel)  °• Endometrial polyp   °• GERD (gastroesophageal reflux disease)   °• History of chronic gastritis   °• History of gastric ulcer   °• History of kidney stones   °• Ovarian mass   °• PONV (postoperative nausea and vomiting)   °• Seasonal allergies   ° ° ° °Past Surgical History:  °Procedure Laterality Date  °• CYSTOSCOPY/RETROGRADE/URETEROSCOPY/STONE EXTRACTION WITH BASKET  x4  last one 2014 approx.  °• DILATATION & CURETTAGE/HYSTEROSCOPY WITH MYOSURE N/A 07/12/2017  ° Procedure: DILATATION &  CURETTAGE/HYSTEROSCOPY WITH MYOSURE;  Surgeon: Fontaine, Timothy P, MD;  Location: [facility];  Service: Gynecology;  Laterality: N/A;  °• KIDNEY SURGERY Left 1980 (age 23)  ° per pt " repaired blood flow to kidney"  °• LAPAROSCOPIC BILATERAL SALPINGO OOPHERECTOMY Bilateral 07/12/2017  ° Procedure: LAPAROSCOPIC BILATERAL SALPINGO OOPHORECTOMY, RIGHT OVARIAN CYSTECTOMY, PELVIC WASHINGS;  Surgeon: Fontaine, Timothy P, MD;  Location: [facility];  Service: Gynecology;  Laterality: Bilateral;  °• MICROLARYNGOSCOPY WITH CO2 LASER AND EXCISION OF VOCAL CORD LESION  2004  ° nodules removed (per pt benign)  °• NASAL SINUS SURGERY  2010  °• TONSILLECTOMY AND ADENOIDECTOMY  age 5  ° °Family History  °Problem Relation Age of Onset  °• Breast cancer Mother 70  °• Clotting disorder Mother   °• Diabetes Mother   °• Liver disease Mother   °• Bladder Cancer Father   °• Colon polyps Brother   °• Stomach cancer Neg Hx   °• Colon cancer Neg Hx   °• Esophageal cancer Neg Hx   °• Pancreatic cancer Neg Hx   ° °Social History  ° °Tobacco Use  °• Smoking status: Former Smoker  °  Years: 2.00  °  Types: Cigarettes  °  Quit date: 07/05/1976  °  Years since quitting: 42.3  °• Smokeless tobacco: Never Used  °Substance Use Topics  °• Alcohol use: No  °• Drug use: No  ° °Current Outpatient Medications  °Medication Sig Dispense Refill  °• budesonide (ENTOCORT EC) 3 MG 24 hr capsule Take 1 capsule (3 mg total) by mouth daily. Take 3 tabs daily for 4 weeks, then 2 tabs daily for 2 weeks, then 1 tab daily for 2 weeks, then done 126 capsule 0  °• NONFORMULARY OR COMPOUNDED ITEM Estradiol vaginal cream 0.02% insert 1 applicator twice weekly 90 each 3  °• omeprazole (PRILOSEC) 40 MG capsule TAKE 1 CAPSULE BY MOUTH EVERY DAY (Patient taking differently: Takes every other day) 90 capsule 1  ° °No current facility-administered medications for this visit.   ° °Allergies  °Allergen Reactions  °• Codeine Nausea And Vomiting   ° ° ° °Review of Systems: °All systems reviewed and negative except where noted in HPI.  ° °Lab Results  °Component Value Date  ° WBC 4.9 07/08/2017  ° HGB 12.9 07/08/2017  ° HCT 38.3 07/08/2017  ° MCV 91.6 07/08/2017  ° PLT 217 07/08/2017  ° ° °Lab Results  °Component Value Date  ° CREATININE 1.01 (H) 07/08/2017  ° BUN 16 07/08/2017  ° NA 139 07/08/2017  ° K 4.3 07/08/2017  ° CL 105 07/08/2017  ° CO2 26 07/08/2017  ° ° °Lab Results  °Component Value Date  ° ALT 11 (L) 07/08/2017  ° AST 22 07/08/2017  ° ALKPHOS 41 07/08/2017  ° BILITOT 0.9 07/08/2017  ° ° °.last ° °Physical Exam: °BP 122/84    Pulse 70      Temp 98.5 °F (36.9 °C)    Ht 5' 4.5" (1.638 m)    Wt 168 lb (76.2 kg)    BMI 28.39 kg/m²  °Constitutional: Pleasant,well-developed, female in no acute distress. °HEENT: Normocephalic and atraumatic. Conjunctivae are normal. No scleral icterus. °Neck supple.  °Cardiovascular: Normal rate, regular rhythm.  °Pulmonary/chest: Effort normal and breath sounds normal. No wheezing, rales or rhonchi. °Abdominal: Soft, nondistended, mild Right sided abdominal tenderness extending into R flank. There are no masses palpable. No hepatomegaly. °Extremities: no edema °Lymphadenopathy: No cervical adenopathy noted. °Neurological: Alert and oriented to person place and time. °Skin: Skin is warm and dry. No rashes noted. °Psychiatric: Normal mood and affect. Behavior is normal. ° ° °ASSESSMENT AND PLAN: °63-year-old female here for reassessment of the following: ° °Crohn's disease of the small bowel / Abdominal pain / Change in bowel habits - as outlined above, history of small bowel Crohn's disease which has been mild over time and no history of known Crohn's complications.  She elected to decline maintenance therapy previously given her disease has been mild and she had felt well in general.  Now with change in bowel habits and some abdominal pain since May.  Her bowel habits are improved with budesonide however her pain has been  relatively unchanged.  History as above, I think we need to confirm if her pain is related to active Crohn's disease or not, and then discuss therapy options.  She continues to wish to avoid biologic therapies for Crohn's if possible.  We will send her to the lab for CBC, c-Met, ESR, CRP.  We will set her stool for C. difficile and fecal calprotectin to ensure okay as she continues to have loose stools.  We will give her some Bentyl to use 10 mg, 1-2 tabs every 8 hours as needed for cramps and see if that helps.  I think cross-sectional imaging will be helpful here given her last imaging has been over a year and a half ago with new symptoms.  She wants to await her blood work and stool test first, pending this result anticipate will need MR enterography to further sort this out.  If she has worsening symptoms moving forward, pending blood work, may transition her to a course of prednisone,  But wants to hold off on that for now while we await results of her test and her course on bentyl. If any worsening in the interim I asked her to contact me. She agreed with the plan, will contact her with results once available.  ° ° , MD °[facility] ° ° °

## 2018-11-13 NOTE — Patient Instructions (Addendum)
If you are age 62 or older, your body mass index should be between 23-30. Your Body mass index is 28.39 kg/m. If this is out of the aforementioned range listed, please consider follow up with your Primary Care Provider.  If you are age 33 or younger, your body mass index should be between 19-25. Your Body mass index is 28.39 kg/m. If this is out of the aformentioned range listed, please consider follow up with your Primary Care Provider.   To help prevent the possible spread of infection to our patients, communities, and staff; we will be implementing the following measures:  As of now we are not allowing any visitors/family members to accompany you to any upcoming appointments with [facility]. If you have any concerns about this please contact our office to discuss prior to the appointment.   Please go to the lab in the basement of our building to have lab work done as you leave today. Hit "B" for basement when you get on the elevator.  When the doors open the lab is on your left.  We will call you with the results. Thank you.  We have sent the following medications to your pharmacy for you to pick up at your convenience: Bentyl 10mg : Take one to two tablets every 8 hours as needed  Thank you for entrusting me with your care and for choosing [facility], Dr. Castalia Cellar

## 2018-11-14 ENCOUNTER — Encounter: Payer: Self-pay | Admitting: Gynecology

## 2018-11-16 ENCOUNTER — Other Ambulatory Visit: Payer: BC Managed Care – PPO

## 2018-11-16 DIAGNOSIS — R194 Change in bowel habit: Secondary | ICD-10-CM

## 2018-11-16 DIAGNOSIS — K5000 Crohn's disease of small intestine without complications: Secondary | ICD-10-CM

## 2018-11-16 DIAGNOSIS — R109 Unspecified abdominal pain: Secondary | ICD-10-CM

## 2018-11-17 LAB — CLOSTRIDIUM DIFFICILE TOXIN B, QUALITATIVE, REAL-TIME PCR: Toxigenic C. Difficile by PCR: NOT DETECTED

## 2018-11-20 LAB — CALPROTECTIN, FECAL: Calprotectin, Fecal: 59 ug/g (ref 0–120)

## 2018-11-21 ENCOUNTER — Other Ambulatory Visit: Payer: Self-pay | Admitting: Gastroenterology

## 2018-11-23 ENCOUNTER — Other Ambulatory Visit: Payer: Self-pay

## 2018-11-23 DIAGNOSIS — K50118 Crohn's disease of large intestine with other complication: Secondary | ICD-10-CM

## 2018-12-01 ENCOUNTER — Ambulatory Visit (HOSPITAL_COMMUNITY)
Admission: RE | Admit: 2018-12-01 | Discharge: 2018-12-01 | Disposition: A | Discharge status: Home / Self Care | Payer: BC Managed Care – PPO | Source: Ambulatory Visit | Attending: Gastroenterology | Admitting: Gastroenterology

## 2018-12-01 ENCOUNTER — Other Ambulatory Visit: Payer: Self-pay

## 2018-12-01 DIAGNOSIS — K50118 Crohn's disease of large intestine with other complication: Secondary | ICD-10-CM

## 2018-12-01 MED ORDER — GADOBUTROL 1 MMOL/ML IV SOLN
8.0000 mL | Freq: Once | INTRAVENOUS | Status: AC | PRN
  Administered 2018-12-01: 8 mL via INTRAVENOUS

## 2018-12-05 ENCOUNTER — Other Ambulatory Visit: Payer: Self-pay

## 2018-12-05 ENCOUNTER — Other Ambulatory Visit: Payer: Self-pay | Admitting: Gastroenterology

## 2018-12-05 DIAGNOSIS — K7460 Unspecified cirrhosis of liver: Secondary | ICD-10-CM

## 2018-12-05 DIAGNOSIS — R188 Other ascites: Secondary | ICD-10-CM

## 2018-12-05 MED ORDER — PREDNISONE 5 MG PO TABS: 40.0000 mg | ORAL_TABLET | Freq: Every day | ORAL | 0 refills | Status: DC

## 2018-12-05 MED ORDER — PREDNISONE 10 MG PO TABS: ORAL_TABLET | ORAL | 0 refills | Status: AC

## 2018-12-05 NOTE — Progress Notes (Signed)
Prednisone sent as 5mg  tablets.  Pt's insurance will only cover a limited number of tablets (150) so changed script to do taper with 10mg  tablets.

## 2019-04-27 ENCOUNTER — Encounter: Payer: BLUE CROSS/BLUE SHIELD | Admitting: Obstetrics and Gynecology

## 2019-05-09 ENCOUNTER — Ambulatory Visit (INDEPENDENT_AMBULATORY_CARE_PROVIDER_SITE_OTHER): Payer: BC Managed Care – PPO | Admitting: Obstetrics and Gynecology

## 2019-05-09 ENCOUNTER — Encounter: Payer: Self-pay | Admitting: Obstetrics and Gynecology

## 2019-05-09 ENCOUNTER — Telehealth: Payer: Self-pay | Admitting: *Deleted

## 2019-05-09 ENCOUNTER — Other Ambulatory Visit: Payer: Self-pay

## 2019-05-09 VITALS — BP 122/76 | Ht 64.0 in | Wt 173.0 lb

## 2019-05-09 DIAGNOSIS — Z13220 Encounter for screening for lipoid disorders: Secondary | ICD-10-CM | POA: Diagnosis not present

## 2019-05-09 DIAGNOSIS — Z01419 Encounter for gynecological examination (general) (routine) without abnormal findings: Secondary | ICD-10-CM

## 2019-05-09 DIAGNOSIS — N952 Postmenopausal atrophic vaginitis: Secondary | ICD-10-CM | POA: Diagnosis not present

## 2019-05-09 MED ORDER — NONFORMULARY OR COMPOUNDED ITEM: 3 refills | Status: DC

## 2019-05-09 NOTE — Telephone Encounter (Signed)
-----   Message from Joseph Pierini, MD sent at 05/09/2019  8:37 AM EDT ----- Regarding: rx refill Good morning, just placed a refill for her compounded estrogen cream, are you able to call that in please?

## 2019-05-09 NOTE — Telephone Encounter (Signed)
Rx called into [facility]

## 2019-05-09 NOTE — Progress Notes (Signed)
Sherena Machorro [birth year] [id number]  SUBJECTIVE:  64 y.o. G1P1 female for annual routine gynecologic exam. She has no gynecologic concerns. Using estrogen cream with good results.  Current Outpatient Medications  Medication Sig Dispense Refill  . NONFORMULARY OR COMPOUNDED ITEM Estradiol vaginal cream 2.35% insert 1 applicator twice weekly 90 each 3  . omeprazole (PRILOSEC) 40 MG capsule TAKE 1 CAPSULE BY MOUTH EVERY DAY (Patient taking differently: Takes every other day) 90 capsule 1  . budesonide (ENTOCORT EC) 3 MG 24 hr capsule Take 1 capsule (3 mg total) by mouth daily. Take 3 tabs daily for 4 weeks, then 2 tabs daily for 2 weeks, then 1 tab daily for 2 weeks, then done (Patient not taking: Reported on 05/09/2019) 126 capsule 0  . dicyclomine (BENTYL) 10 MG capsule Take 1-2 capsules (10-20 mg total) by mouth every 8 (eight) hours as needed for spasms. (Patient not taking: Reported on 05/09/2019) 90 capsule 3   No current facility-administered medications for this visit.   Allergies: Codeine  No LMP recorded. Patient is postmenopausal.  Past medical history,surgical history, problem list, medications, allergies, family history and social history were all reviewed and documented as reviewed in the EPIC chart.  ROS:  Feeling well. No dyspnea or chest pain on exertion.  No abdominal pain, change in bowel habits, black or bloody stools.  No urinary tract symptoms. GYN ROS: no abnormal bleeding, pelvic pain or discharge, no breast pain or new or enlarging lumps on self exam. No neurological complaints.   OBJECTIVE:  BP 122/76   Ht 5' 4"  (1.626 m)   Wt 173 lb (78.5 kg)   BMI 29.70 kg/m  The patient appears well, alert, oriented x 3, in no distress. ENT normal.  Neck supple. No cervical or supraclavicular adenopathy or thyromegaly.  Lungs are clear, good air entry, no wheezes, rhonchi or rales. S1 and S2 normal, no murmurs, regular rate and rhythm.  Abdomen soft without tenderness, guarding,  mass or organomegaly.  Neurological is normal, no focal findings.  BREAST EXAM: breasts appear normal, no suspicious masses, no skin or nipple changes or axillary nodes  PELVIC EXAM: VULVA: normal appearing vulva with no masses, tenderness or lesions, VAGINA: normal appearing vagina with normal color and discharge, no lesions, CERVIX: normal appearing cervix without discharge or lesions, UTERUS: uterus is normal size, shape, consistency and nontender, ADNEXA: normal adnexa in size, nontender and no masses  Chaperone: [name] present during the examination  ASSESSMENT:  64 y.o. G1P1 here for annual gynecologic exam  PLAN:   1. Postmenopausal. Prior BSO.  No vaginal bleeding.  No significant hot flashes. 2.  Atrophic vaginitis.  Symptoms have responded well to use of vaginal estrogen cream.  She is getting a compounded form and refill is requested today and provided for 1 year. 3. Pap smear reported 2019.  I do not have copies of these results but she reports it was normal.  Plan repeat Pap smear at 3-year interval. 4. Mammogram 02/2019.  Normal breast exam today.  Continue annual mammography. 5. Colonoscopy 2018.  Recommended that she follow up at the recommended interval.  6. DEXA 2018.  States this was normal. Report not on file here in office.  With that information, her next DEXA would be due in 2023 at the 5-year interval.  Encouraged calcium and vitamin D supplement, weightbearing exercise. 7. Health maintenance.  She will proceed to lab today for routine screening blood work (lipids, CBC, CMP).  Return annually or sooner, prn.  Joseph Pierini MD, FACOG  05/09/19

## 2019-05-10 LAB — COMPREHENSIVE METABOLIC PANEL
AG Ratio: 1.7 (calc) (ref 1.0–2.5)
ALT: 11 U/L (ref 6–29)
AST: 17 U/L (ref 10–35)
Albumin: 4.5 g/dL (ref 3.6–5.1)
Alkaline phosphatase (APISO): 64 U/L (ref 37–153)
BUN: 16 mg/dL (ref 7–25)
CO2: 30 mmol/L (ref 20–32)
Calcium: 9.8 mg/dL (ref 8.6–10.4)
Chloride: 106 mmol/L (ref 98–110)
Creat: 0.92 mg/dL (ref 0.50–0.99)
Globulin: 2.6 g/dL (calc) (ref 1.9–3.7)
Glucose, Bld: 90 mg/dL (ref 65–99)
Potassium: 5 mmol/L (ref 3.5–5.3)
Sodium: 142 mmol/L (ref 135–146)
Total Bilirubin: 0.5 mg/dL (ref 0.2–1.2)
Total Protein: 7.1 g/dL (ref 6.1–8.1)

## 2019-05-10 LAB — LIPID PANEL
Cholesterol: 173 mg/dL (ref <200)
HDL: 71 mg/dL (ref >=50)
LDL Cholesterol (Calc): 82 mg/dL (calc)
Non-HDL Cholesterol (Calc): 102 mg/dL (calc) (ref <130)
Total CHOL/HDL Ratio: 2.4 (calc) (ref <5.0)
Triglycerides: 102 mg/dL (ref <150)

## 2019-05-10 LAB — CBC
HCT: 42.9 % (ref 35.0–45.0)
Hemoglobin: 13.8 g/dL (ref 11.7–15.5)
MCH: 29.7 pg (ref 27.0–33.0)
MCHC: 32.2 g/dL (ref 32.0–36.0)
MCV: 92.5 fL (ref 80.0–100.0)
MPV: 10.6 fL (ref 7.5–12.5)
Platelets: 235 10*3/uL (ref 140–400)
RBC: 4.64 10*6/uL (ref 3.80–5.10)
RDW: 12.4 % (ref 11.0–15.0)
WBC: 3.9 10*3/uL (ref 3.8–10.8)

## 2019-09-07 ENCOUNTER — Other Ambulatory Visit: Payer: Self-pay | Admitting: Gastroenterology

## 2020-06-17 NOTE — Progress Notes (Signed)
65 y.o. G1P1 Married White or Caucasian female here for annual exam.     No LMP recorded. Patient is postmenopausal. 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 Has one daughter who is a PA in [place].   Sexually active: Yes.    The current method of family planning is postmenopausal.    Exercising: Yes.    running Smoker:  no  Health Maintenance: Pap:  2019 neg per patient History of abnormal Pap:  no MMG:  Done yesterday, Neg Colonoscopy:  2018 BMD:   2018 Gardasil:   n/a Covid-19: not done Hep C testing: not done Screening Labs: wants today   reports that she quit smoking about 43 years ago. Her smoking use included cigarettes. She quit after 2.00 years of use. She has never used smokeless tobacco. She reports that she does not drink alcohol and does not use drugs.  Past Medical History:  Diagnosis Date  . Arthritis   . Crohn's ileitis [facility])    followed by dr Havery Moros--  mild crohn's ileitis (distal small bowel)  . Endometrial polyp   . GERD (gastroesophageal reflux disease)   . History of chronic gastritis   . History of gastric ulcer   . History of kidney stones   . Ovarian mass   . PONV (postoperative nausea and vomiting)   . Seasonal allergies     Past Surgical History:  Procedure Laterality Date  . CYSTOSCOPY/RETROGRADE/URETEROSCOPY/STONE EXTRACTION WITH BASKET  x4  last one 2014 approx.  Marland Kitchen DILATATION & CURETTAGE/HYSTEROSCOPY WITH MYOSURE N/A 07/12/2017   Procedure: [place];  Surgeon: Anastasio Auerbach, MD;  Location: [place];  Service: Gynecology;  Laterality: N/A;  .  KIDNEY SURGERY Left 68 (age 4)   per pt " repaired blood flow to kidney"  . LAPAROSCOPIC BILATERAL SALPINGO OOPHERECTOMY Bilateral 07/12/2017   Procedure: LAPAROSCOPIC BILATERAL SALPINGO OOPHORECTOMY, RIGHT OVARIAN CYSTECTOMY, PELVIC WASHINGS;  Surgeon: Anastasio Auerbach, MD;  Location: [place];  Service: Gynecology;  Laterality: Bilateral;  . MICROLARYNGOSCOPY WITH CO2 LASER AND EXCISION OF VOCAL CORD LESION  2004   nodules removed (per pt benign)  . NASAL SINUS SURGERY  2010  . TONSILLECTOMY AND ADENOIDECTOMY  age 83    Current Outpatient Medications  Medication Sig Dispense Refill  . dicyclomine (BENTYL) 10 MG capsule Take 1-2 capsules (10-20 mg total) by mouth every 8 (eight) hours as needed for spasms. 90 capsule 3  . omeprazole (PRILOSEC) 40 MG capsule TAKE 1 CAPSULE BY MOUTH EVERY DAY (Patient taking differently: 40 mg daily.) 90 capsule 1   No current facility-administered medications for this visit.    Family History  Problem Relation Age of Onset  . Breast cancer Mother 22  . Clotting disorder Mother   . Diabetes Mother   . Liver disease Mother   . Bladder Cancer Father   . Colon polyps Brother   . Stomach cancer Neg Hx   . Colon cancer Neg Hx   . Esophageal cancer Neg Hx   . Pancreatic cancer Neg Hx     Review of Systems  Constitutional:  Negative.   HENT: Negative.   Eyes: Negative.   Respiratory: Negative.   Cardiovascular: Negative.   Gastrointestinal: Negative.   Endocrine: Negative.   Genitourinary: Negative.   Musculoskeletal: Negative.   Skin: Negative.   Allergic/Immunologic: Negative.   Neurological: Negative.   Hematological: Negative.   Psychiatric/Behavioral: Negative.     Exam:   BP 118/78   Pulse 63   Resp 16   Ht 5' 3.25" (1.607 m)   Wt 171 lb (77.6 kg)   BMI 30.05 kg/m   Height: 5' 3.25" (160.7 cm)  General appearance: alert, cooperative and appears stated age, no acute distress Head: Normocephalic, without  obvious abnormality Neck: no adenopathy, thyroid normal to inspection and palpation Lungs: clear to auscultation bilaterally Breasts: No axillary or supraclavicular adenopathy, Normal to palpation without dominant masses Heart: regular rate and rhythm Abdomen: soft, non-tender; no masses,  no organomegaly Extremities: extremities normal, no edema Skin: No rashes or lesions Lymph nodes: Cervical, supraclavicular, and axillary nodes normal. No abnormal inguinal nodes palpated Neurologic: Grossly normal   Pelvic: External genitalia:  no lesions              Urethra:  normal appearing urethra with no masses, tenderness or lesions              Bartholins and Skenes: normal                 Vagina: normal appearing vagina, appropriate for age, normal appearing discharge, no lesions              Cervix:              Bimanual Exam:   Uterus: normal size, shape, non tender              Adnexa: no mass, fullness, tenderness               Rectal: hemorrhoid, no mass   A:  Well woman exam with routine gynecological exam - Plan: Cytology - PAP( [place])  Screening for lipid disorders - Plan: Lipid panel  Screening for endocrine, metabolic and immunity disorder - Plan: Comp Met (CMET), TSH  Screening for deficiency anemia - Plan: CBC  P:   Pap : collected today  Mammogram: done today, due 1 year  Labs: screening labs done here as above  Medications: n/a  Expressed difficulty losing weight, discussed maybe adding weight training

## 2020-06-20 ENCOUNTER — Encounter: Payer: Self-pay | Admitting: Nurse Practitioner

## 2020-06-20 ENCOUNTER — Other Ambulatory Visit (HOSPITAL_COMMUNITY)
Admission: RE | Admit: 2020-06-20 | Discharge: 2020-06-20 | Disposition: A | Discharge status: Home / Self Care | Payer: BC Managed Care – PPO | Source: Ambulatory Visit | Attending: Nurse Practitioner | Admitting: Nurse Practitioner

## 2020-06-20 ENCOUNTER — Other Ambulatory Visit: Payer: Self-pay

## 2020-06-20 ENCOUNTER — Ambulatory Visit (INDEPENDENT_AMBULATORY_CARE_PROVIDER_SITE_OTHER): Payer: BC Managed Care – PPO | Admitting: Nurse Practitioner

## 2020-06-20 VITALS — BP 118/78 | HR 63 | Resp 16 | Ht 63.25 in | Wt 171.0 lb

## 2020-06-20 DIAGNOSIS — Z1329 Encounter for screening for other suspected endocrine disorder: Secondary | ICD-10-CM | POA: Diagnosis not present

## 2020-06-20 DIAGNOSIS — Z13220 Encounter for screening for lipoid disorders: Secondary | ICD-10-CM | POA: Diagnosis not present

## 2020-06-20 DIAGNOSIS — Z01419 Encounter for gynecological examination (general) (routine) without abnormal findings: Secondary | ICD-10-CM | POA: Insufficient documentation

## 2020-06-20 DIAGNOSIS — Z130 Encounter for screening for diseases of the blood and blood-forming organs and certain disorders involving the immune mechanism: Secondary | ICD-10-CM

## 2020-06-20 DIAGNOSIS — Z13228 Encounter for screening for other metabolic disorders: Secondary | ICD-10-CM

## 2020-06-20 NOTE — Patient Instructions (Signed)

## 2020-06-21 LAB — COMPREHENSIVE METABOLIC PANEL
AG Ratio: 1.7 (calc) (ref 1.0–2.5)
ALT: 14 U/L (ref 6–29)
AST: 20 U/L (ref 10–35)
Albumin: 4.5 g/dL (ref 3.6–5.1)
Alkaline phosphatase (APISO): 57 U/L (ref 37–153)
BUN/Creatinine Ratio: 17 (calc) (ref 6–22)
BUN: 18 mg/dL (ref 7–25)
CO2: 29 mmol/L (ref 20–32)
Calcium: 9.8 mg/dL (ref 8.6–10.4)
Chloride: 105 mmol/L (ref 98–110)
Creat: 1.04 mg/dL — ABNORMAL HIGH (ref 0.50–0.99)
Globulin: 2.6 g/dL (calc) (ref 1.9–3.7)
Glucose, Bld: 81 mg/dL (ref 65–99)
Potassium: 4.1 mmol/L (ref 3.5–5.3)
Sodium: 140 mmol/L (ref 135–146)
Total Bilirubin: 0.5 mg/dL (ref 0.2–1.2)
Total Protein: 7.1 g/dL (ref 6.1–8.1)

## 2020-06-21 LAB — TSH: TSH: 1.71 mIU/L (ref 0.40–4.50)

## 2020-06-21 LAB — CBC
HCT: 40.5 % (ref 35.0–45.0)
Hemoglobin: 13.3 g/dL (ref 11.7–15.5)
MCH: 30.3 pg (ref 27.0–33.0)
MCHC: 32.8 g/dL (ref 32.0–36.0)
MCV: 92.3 fL (ref 80.0–100.0)
MPV: 10.3 fL (ref 7.5–12.5)
Platelets: 224 10*3/uL (ref 140–400)
RBC: 4.39 10*6/uL (ref 3.80–5.10)
RDW: 12 % (ref 11.0–15.0)
WBC: 4.8 10*3/uL (ref 3.8–10.8)

## 2020-06-21 LAB — LIPID PANEL
Cholesterol: 192 mg/dL (ref <200)
HDL: 64 mg/dL (ref >=50)
LDL Cholesterol (Calc): 105 mg/dL (calc) — ABNORMAL HIGH
Non-HDL Cholesterol (Calc): 128 mg/dL (calc) (ref <130)
Total CHOL/HDL Ratio: 3 (calc) (ref <5.0)
Triglycerides: 129 mg/dL (ref <150)

## 2020-06-23 ENCOUNTER — Other Ambulatory Visit: Payer: Self-pay | Admitting: Nurse Practitioner

## 2020-06-23 DIAGNOSIS — Z130 Encounter for screening for diseases of the blood and blood-forming organs and certain disorders involving the immune mechanism: Secondary | ICD-10-CM

## 2020-06-23 DIAGNOSIS — Z13228 Encounter for screening for other metabolic disorders: Secondary | ICD-10-CM

## 2020-06-24 LAB — CYTOLOGY - PAP
Comment: NEGATIVE
Diagnosis: NEGATIVE
High risk HPV: NEGATIVE

## 2020-10-07 ENCOUNTER — Other Ambulatory Visit: Payer: Self-pay

## 2020-12-03 ENCOUNTER — Other Ambulatory Visit: Payer: BC Managed Care – PPO

## 2021-01-05 ENCOUNTER — Encounter: Payer: Self-pay | Admitting: Physician Assistant

## 2021-01-05 ENCOUNTER — Ambulatory Visit (INDEPENDENT_AMBULATORY_CARE_PROVIDER_SITE_OTHER): Payer: BC Managed Care – PPO | Admitting: Physician Assistant

## 2021-01-05 ENCOUNTER — Other Ambulatory Visit (INDEPENDENT_AMBULATORY_CARE_PROVIDER_SITE_OTHER): Payer: BC Managed Care – PPO

## 2021-01-05 VITALS — BP 116/70 | HR 64 | Ht 63.0 in | Wt 175.4 lb

## 2021-01-05 DIAGNOSIS — R109 Unspecified abdominal pain: Secondary | ICD-10-CM

## 2021-01-05 DIAGNOSIS — R197 Diarrhea, unspecified: Secondary | ICD-10-CM

## 2021-01-05 DIAGNOSIS — Z8719 Personal history of other diseases of the digestive system: Secondary | ICD-10-CM | POA: Diagnosis not present

## 2021-01-05 LAB — COMPREHENSIVE METABOLIC PANEL
ALT: 15 U/L (ref 0–35)
AST: 20 U/L (ref 0–37)
Albumin: 4.4 g/dL (ref 3.5–5.2)
Alkaline Phosphatase: 57 U/L (ref 39–117)
BUN: 14 mg/dL (ref 6–23)
CO2: 29 mEq/L (ref 19–32)
Calcium: 9.1 mg/dL (ref 8.4–10.5)
Chloride: 105 mEq/L (ref 96–112)
Creatinine, Ser: 0.92 mg/dL (ref 0.40–1.20)
GFR: 65.95 mL/min (ref >60.00)
Glucose, Bld: 86 mg/dL (ref 70–99)
Potassium: 4.6 mEq/L (ref 3.5–5.1)
Sodium: 140 mEq/L (ref 135–145)
Total Bilirubin: 0.7 mg/dL (ref 0.2–1.2)
Total Protein: 7.2 g/dL (ref 6.0–8.3)

## 2021-01-05 LAB — CBC WITH DIFFERENTIAL/PLATELET
Basophils Absolute: 0 10*3/uL (ref 0.0–0.1)
Basophils Relative: 0.6 % (ref 0.0–3.0)
Eosinophils Absolute: 0.1 10*3/uL (ref 0.0–0.7)
Eosinophils Relative: 2.4 % (ref 0.0–5.0)
HCT: 39.8 % (ref 36.0–46.0)
Hemoglobin: 13.2 g/dL (ref 12.0–15.0)
Lymphocytes Relative: 37.4 % (ref 12.0–46.0)
Lymphs Abs: 1.4 10*3/uL (ref 0.7–4.0)
MCHC: 33.1 g/dL (ref 30.0–36.0)
MCV: 91.6 fl (ref 78.0–100.0)
Monocytes Absolute: 0.3 10*3/uL (ref 0.1–1.0)
Monocytes Relative: 7.3 % (ref 3.0–12.0)
Neutro Abs: 2 10*3/uL (ref 1.4–7.7)
Neutrophils Relative %: 52.3 % (ref 43.0–77.0)
Platelets: 215 10*3/uL (ref 150.0–400.0)
RBC: 4.34 Mil/uL (ref 3.87–5.11)
RDW: 12.9 % (ref 11.5–15.5)
WBC: 3.7 10*3/uL — ABNORMAL LOW (ref 4.0–10.5)

## 2021-01-05 LAB — C-REACTIVE PROTEIN: CRP: <1 mg/dL (ref 0.5–20.0)

## 2021-01-05 LAB — SEDIMENTATION RATE: Sed Rate: 14 mm/hr (ref 0–30)

## 2021-01-05 MED ORDER — DICYCLOMINE HCL 10 MG PO CAPS: 10.0000 mg | ORAL_CAPSULE | Freq: Four times a day (QID) | ORAL | 4 refills | Status: DC | PRN

## 2021-01-05 NOTE — Progress Notes (Signed)
Subjective:    Patient ID: Alyssa Strong, female    DOB: 27-Jul-1955, 65 y.o.   MRN: [id number]  HPI [place] is a pleasant 65 year old white female, known to Dr. Havery Moros, who was last seen in the office in September 2020.  She has history of Crohn's ileitis, initially diagnosed in 2012. She comes in today with complaints of ongoing right-sided abdominal pain/discomfort, present over the past 8 or 9 months and worse over the past 4 to 5 months.  She says pain is currently constant and achy in nature located in the right upper and right mid to lower abdomen.  She says she usually feels better standing and has more discomfort with sitting.  She has chronic loose stools without any significant change generally having 2 bowel movements per day but always loose.  No melena or hematochezia.  Appetite has been okay, weight has been stable, no nausea or vomiting, no fever or chills. She says the pain has never been constant like this in the past and she is ready to have what ever treatment is felt necessary at this point to get her symptoms under control. By prior notes she had been reluctant to consider biologic therapy in the past.  She has been given courses of prednisone and budesonide and said neither 1 was very helpful. Her last colonoscopy was done in November 2018 with finding of 2 small erosions in the terminal ileum, biopsies however were normal.  Capsule endoscopy 2018 showed mild small bowel Crohn's.  Again she was asymptomatic at that time. CT enterography in March 2019 showed no active small bowel disease, there was some fullness in the right ovary.  MR enterography October 2020 findings suggestive of mild active Crohn's enteritis involving a second minute of jejunum in the left abdomen, no evidence of bowel obstruction fistula or abscess, mild wall thickening in the terminal ileum without evidence of active ileitis, stable hepatic hemangiomas.    Review of Systems. Pertinent positive and  negative review of systems were noted in the above HPI section.  All other review of systems was otherwise negative.   Outpatient Encounter Medications as of 01/05/2021  Medication Sig   omeprazole (PRILOSEC) 40 MG capsule TAKE 1 CAPSULE BY MOUTH EVERY DAY (Patient taking differently: 40 mg daily.)   [DISCONTINUED] dicyclomine (BENTYL) 10 MG capsule Take 1-2 capsules (10-20 mg total) by mouth every 8 (eight) hours as needed for spasms.   dicyclomine (BENTYL) 10 MG capsule Take 1-2 capsules (10-20 mg total) by mouth every 6 (six) hours as needed for spasms.   No facility-administered encounter medications on file as of 01/05/2021.   Allergies  Allergen Reactions   Codeine Nausea And Vomiting   There are no problems to display for this patient.  Social History   Socioeconomic History   Marital status: Married    Spouse name: Not on file   Number of children: 1   Years of education: Not on file   Highest education level: Not on file  Occupational History   Occupation: Administrative Assist  Tobacco Use   Smoking status: Former    Years: 2.00    Types: Cigarettes    Quit date: 07/05/1976    Years since quitting: 44.5   Smokeless tobacco: Never   Tobacco comments:    in high school  Vaping Use   Vaping Use: Never used  Substance and Sexual Activity   Alcohol use: No   Drug use: No   Sexual activity: Yes    Partners:  Male    Birth control/protection: Post-menopausal  Other Topics Concern   Not on file  Social History Narrative   Daughter is a PA at [employer] Determinants of Health   Financial Resource Strain: Not on file  Food Insecurity: Not on file  Transportation Needs: Not on file  Physical Activity: Not on file  Stress: Not on file  Social Connections: Not on file  Intimate Partner Violence: Not on file    Ms. Patil's family history includes Bladder Cancer in her father; Breast cancer (age of onset: 70) in her mother; Clotting disorder in her  mother; Colon polyps in her brother; Diabetes in her mother; Liver disease in her mother.      Objective:    Vitals:   01/05/21 1032  BP: 116/70  Pulse: 64    Physical Exam Well-developed well-nourished WF  in no acute distress.  Height, Weight, 175 BMI 31.07  HEENT; nontraumatic normocephalic, EOMI, PE R LA, sclera anicteric. Oropharynx; not examined today Neck; supple, no JVD Cardiovascular; regular rate and rhythm with S1-S2, no murmur rub or gallop Pulmonary; Clear bilaterally Abdomen; soft, she is tender in the right mid and right lower quadrant, mildly in the right upper quadrant, nondistended, no palpable mass or hepatosplenomegaly, bowel sounds are active Rectal; not done today Skin; benign exam, no jaundice rash or appreciable lesions Extremities; no clubbing cyanosis or edema skin warm and dry Neuro/Psych; alert and oriented x4, grossly nonfocal mood and affect appropriate        Assessment & Plan:   #15 65 year old white female with history of Crohn's ileitis, initially diagnosed 2012, Patient last seen about 2 years ago, and MR enterography at that time showed mild active ileitis and mildly active Crohn's in a segment of jejunum. She has not been on any maintenance regimen. Now with 4 to 49-monthhistory of fairly constant right-sided abdominal pain in the right mid and right lower quadrant, increased over any prior symptoms, also with chronic loose stools generally 2 bowel movements per day.  Suspect her current symptoms are secondary to active Crohn's, perhaps progressive.  Plan; CBC with differential, c-Met, sed rate, CRP, fecal calprotectin In anticipation of biologic therapy will also check hepatitis serologies and QuantiFERON gold Patient will be scheduled for MRI of the abdomen with enterography to reassess extent of disease, and rule out any other intra-abdominal inflammatory process.  Patient is willing to proceed with biologic therapy if indicated. Will  hold on initiating steroids until labs and MRI reviewed, then determine further treatment plan.   AAlfredia FergusonPA-C 01/05/2021   Cc: DKennieth Rad MD

## 2021-01-05 NOTE — Progress Notes (Signed)
Agree with assessment and plan as outlined. Agree with establishing active disease causing her symptoms prior to proceeding with escalation in therapy for IBD. Hopefully MRI can be done soon. Can try some bentyl in the interim to see if that will help and await labs. If worsening pain or intolerant of PO moving forward she needs to contact us. Would stay on a low residual diet for now.

## 2021-01-05 NOTE — Patient Instructions (Signed)
If you are age 64 or younger, your body mass index should be between 19-25. Your Body mass index is 31.07 kg/m. If this is out of the aformentioned range listed, please consider follow up with your Primary Care Provider.  ________________________________________________________  The [facility] providers would like to encourage you to use [facility] to communicate with providers for non-urgent requests or questions.  Due to long hold times on the telephone, sending your provider a message by [facility] may be a faster and more efficient way to get a response.  Please allow 48 business hours for a response.  Please remember that this is for non-urgent requests.  _______________________________________________________  Your provider has requested that you go to the basement level for lab work before leaving today. Press "B" on the elevator. The lab is located at the first door on the left as you exit the elevator.  You have been scheduled for an MRI at [facility] on 01/21/2021. Your appointment time is 4:00 pm. Please arrive to admitting (at main entrance of the hospital) 2 hours prior to your appointment time for registration purposes. Please make certain not to have anything to eat or drink 6 hours prior to your test. In addition, if you have any metal in your body, have a pacemaker or defibrillator, please be sure to let your ordering physician know. This test typically takes 45 minutes to 1 hour to complete. Should you need to reschedule, please call [phone] to do so.  Continue Dicyclomine 10 mg 1 tablet every 6 hours as needed for pain/cramping.  Follow up pending the results of your MRI  Thank you for entrusting me with your care and choosing [facility].  Amy Esterwood, PA-C

## 2021-01-06 ENCOUNTER — Other Ambulatory Visit: Payer: BC Managed Care – PPO

## 2021-01-06 DIAGNOSIS — R109 Unspecified abdominal pain: Secondary | ICD-10-CM

## 2021-01-06 DIAGNOSIS — R197 Diarrhea, unspecified: Secondary | ICD-10-CM

## 2021-01-06 DIAGNOSIS — Z8719 Personal history of other diseases of the digestive system: Secondary | ICD-10-CM

## 2021-01-06 LAB — HEPATITIS C ANTIBODY
Hepatitis C Ab: NONREACTIVE
SIGNAL TO CUT-OFF: <0.02 (ref <1.00)

## 2021-01-06 LAB — HEPATITIS B SURFACE ANTIBODY,QUALITATIVE: Hep B S Ab: NONREACTIVE

## 2021-01-06 LAB — HEPATITIS B SURFACE ANTIGEN: Hepatitis B Surface Ag: NONREACTIVE

## 2021-01-07 LAB — QUANTIFERON-TB GOLD PLUS
Mitogen-NIL: >10 IU/mL
NIL: 0.04 IU/mL
QuantiFERON-TB Gold Plus: NEGATIVE
TB1-NIL: 0 IU/mL
TB2-NIL: 0 IU/mL

## 2021-01-12 LAB — CALPROTECTIN, FECAL: Calprotectin, Fecal: 35 ug/g (ref 0–120)

## 2021-01-14 ENCOUNTER — Other Ambulatory Visit: Payer: Self-pay

## 2021-01-14 DIAGNOSIS — R109 Unspecified abdominal pain: Secondary | ICD-10-CM

## 2021-01-14 DIAGNOSIS — Z8719 Personal history of other diseases of the digestive system: Secondary | ICD-10-CM

## 2021-01-21 ENCOUNTER — Ambulatory Visit (HOSPITAL_COMMUNITY): Payer: BC Managed Care – PPO

## 2021-02-02 ENCOUNTER — Ambulatory Visit (HOSPITAL_COMMUNITY): Payer: BC Managed Care – PPO

## 2021-02-02 ENCOUNTER — Telehealth: Payer: Self-pay | Admitting: Physician Assistant

## 2021-02-02 NOTE — Telephone Encounter (Signed)
Forwarded this message to our referral coordinating specialists.

## 2021-02-02 NOTE — Telephone Encounter (Signed)
New authorization will be done. Our dept staff are aware now. Advised the patient.

## 2021-02-02 NOTE — Telephone Encounter (Signed)
Inbound call from patient, states that she was supposed to have a MRI and CT done today and had to move it back due to insurance purposes. Patient had pre approval from Dec 1st to Dec 31st and rescheduled appointment was made for Jan 5th. Patient is seeking advice if she is going to need another pre approval for new date. Please advise.

## 2021-02-13 ENCOUNTER — Telehealth: Payer: Self-pay | Admitting: Physician Assistant

## 2021-02-13 NOTE — Telephone Encounter (Signed)
Called the patient.no answer. Left her a message asking she return my call.

## 2021-02-13 NOTE — Telephone Encounter (Signed)
Beth, please see telephone encounter from today. MRI is requiring a peer to peer which may not be able to be completed by Amy until next week. She has asked that the patient's MRI be rescheduled a little further out. Please notify Es if patient's appt is rescheduled. Thanks

## 2021-02-13 NOTE — Telephone Encounter (Signed)
MRI abd needs a peer to peer done. It did not meet criteria.  Pt was scheduled for December 7 and then December 19 but cancelled.  She rescheduled for 02/19/2021. The insurance authorization expired.   New authorization only approved the pelvis not the abdomen.  Phone number [phone]

## 2021-02-13 NOTE — Telephone Encounter (Signed)
Hey Amy this pt MRI abd needs a peer to peer done did not meet criteria.  Pt was scheduled for today but cx and rescheduled for 02/19/2021 auth expired so I had to create a new auth which only approved the pelvis not the abd  Phone number [phone]

## 2021-02-19 ENCOUNTER — Other Ambulatory Visit: Payer: Self-pay

## 2021-02-19 ENCOUNTER — Ambulatory Visit (HOSPITAL_COMMUNITY)
Admission: RE | Admit: 2021-02-19 | Discharge: 2021-02-19 | Disposition: A | Discharge status: Home / Self Care | Payer: BC Managed Care – PPO | Source: Ambulatory Visit | Attending: Physician Assistant | Admitting: Physician Assistant

## 2021-02-19 DIAGNOSIS — R109 Unspecified abdominal pain: Secondary | ICD-10-CM | POA: Diagnosis present

## 2021-02-19 DIAGNOSIS — R197 Diarrhea, unspecified: Secondary | ICD-10-CM

## 2021-02-19 DIAGNOSIS — Z8719 Personal history of other diseases of the digestive system: Secondary | ICD-10-CM | POA: Diagnosis present

## 2021-02-19 MED ORDER — GADOBUTROL 1 MMOL/ML IV SOLN
8.0000 mL | Freq: Once | INTRAVENOUS | Status: AC | PRN
  Administered 2021-02-19: 8 mL via INTRAVENOUS

## 2021-02-24 ENCOUNTER — Telehealth: Payer: Self-pay

## 2021-02-24 NOTE — Telephone Encounter (Signed)
-----   Message from Alfredia Ferguson, PA-C sent at 02/24/2021 10:10 AM EST ----- Please review patient's MR enterography which does show suggestion of some inflammatory involvement of the proximal small bowel in the left hemiabdomen with wall thickening and mucosal hyperenhancement. I am disappointed that they are suggesting CT enterography.  Decision needs to be made regarding starting biologic therapy on this patient.  I think it would be a good idea for her to be seen by you prior to initiating.  I will asked my nurse to get her a follow-up appointment first available.

## 2021-02-24 NOTE — Telephone Encounter (Signed)
Left message for patient to please call back. 

## 2021-03-05 ENCOUNTER — Ambulatory Visit: Payer: BC Managed Care – PPO | Admitting: Gastroenterology

## 2021-04-09 ENCOUNTER — Ambulatory Visit: Payer: BC Managed Care – PPO | Admitting: Gastroenterology

## 2021-04-09 ENCOUNTER — Other Ambulatory Visit (INDEPENDENT_AMBULATORY_CARE_PROVIDER_SITE_OTHER): Payer: BC Managed Care – PPO

## 2021-04-09 ENCOUNTER — Encounter: Payer: Self-pay | Admitting: Gastroenterology

## 2021-04-09 VITALS — BP 146/70 | HR 84 | Ht 63.0 in | Wt 165.0 lb

## 2021-04-09 DIAGNOSIS — R109 Unspecified abdominal pain: Secondary | ICD-10-CM

## 2021-04-09 DIAGNOSIS — K5000 Crohn's disease of small intestine without complications: Secondary | ICD-10-CM

## 2021-04-09 DIAGNOSIS — K219 Gastro-esophageal reflux disease without esophagitis: Secondary | ICD-10-CM

## 2021-04-09 DIAGNOSIS — Z8719 Personal history of other diseases of the digestive system: Secondary | ICD-10-CM

## 2021-04-09 DIAGNOSIS — Z23 Encounter for immunization: Secondary | ICD-10-CM

## 2021-04-09 LAB — VITAMIN D 25 HYDROXY (VIT D DEFICIENCY, FRACTURES): VITD: 21.98 ng/mL — ABNORMAL LOW (ref 30.00–100.00)

## 2021-04-09 LAB — IBC + FERRITIN
Ferritin: 99.5 ng/mL (ref 10.0–291.0)
Iron: 83 ug/dL (ref 42–145)
Saturation Ratios: 24.1 % (ref 20.0–50.0)
TIBC: 344.4 ug/dL (ref 250.0–450.0)
Transferrin: 246 mg/dL (ref 212.0–360.0)

## 2021-04-09 LAB — VITAMIN B12: Vitamin B-12: 209 pg/mL — ABNORMAL LOW (ref 211–911)

## 2021-04-09 MED ORDER — OMEPRAZOLE 40 MG PO CPDR: 40.0000 mg | DELAYED_RELEASE_CAPSULE | Freq: Every day | ORAL | 3 refills | Status: DC

## 2021-04-09 NOTE — Progress Notes (Signed)
HPI :  66 year old female here for follow-up visit   Crohn's history: Diagnosed late 2012 with colonoscopy with erosions in the terminal ileum with biopsy showing granulomas. She had a Capsule study late 2012 with findings of distal ileal ulcerations, erosions and colitis. He had a good response to steroids initially. Eventually changed to 6-MP with good results. Stopped it and had a flare, took again for a period of time and had stopped it when she represented to our office in 11/2016.  Colonoscopy 12/2016 with 2 small erosions in TI but biopsies normal. Capsule endoscopy with mild small bowel Crohn's disease. She was asymptomatic at the time and she elected to monitor   SINCE LAST VISIT   66 year old female here for follow-up visit to discuss her Crohn's disease. I last saw her in September 2020 and she was last seen in the office by Nicoletta Ba in November 2022.  Recall she has had small bowel Crohn's disease diagnosed with imaging, capsule endoscopy, colonoscopy in years past.  She has elected for observation for the most part.  Her symptoms appear to be right-sided abdominal pain and bloating that bothers her.  Over time it appears to be getting worse, can fluctuate over times, she has never had a severe flare leading to hospitalization or bowel obstruction etc. however it does bother her.  She can function with it however.  She denies any NSAID use.  No nausea or vomiting or obstructive symptoms.  She does have fatigue.  Eating does seem to make the pain worse.  She has been trying to lose weight however, lost about 10 pounds with change in diet.  Her father was recently diagnosed with bile duct cancer who is not doing well and she is anxious about that.  She seldom has formed stools, these are usually loose.  Denies any blood in the stool but does have some blood noted on the toilet paper.  We have given her a course of budesonide in the past which did not really help her too much.  She has  had prednisone in recent years which has helped her symptoms, she has never really tried biologic therapy.  She was on 6-MP remotely a few years ago which did appear to help her symptoms but she did not like taking it and stopped it.  We had a CT enterography performed in March 2019 which did not show any active small bowel Crohn's disease.  She had a suggestion of a fullness in the right ovary which led to a pelvic ultrasound showing a suspected ovarian mass.  She was then referred to gynecology and had resection which she was told was benign.  She had an MRE in 11/2018 and again in 02/20/21 as outlined below, both showing small bowel inflammation.   Of note the patient otherwise has a history of reflux, has been bothering her more so lately.  Needs refill of omeprazole 40 mg which works well for her.  Most recent workup: Colonoscopy 12/21/2016 - 2 small erosions in the TI, sigmoid diverticulosis, normal colon otherwise - biopsies were normal. Colon biopsies normal.    Capsule endoscopy 01/17/2017 - gastritis, 2 ulcerations seen in the distal small bowel - suggestive of mild Crohn's disease   CT enterography 04/28/2017 - no active small bowel disease, suggestion of fullness of the right ovary    MRE 12/11/2018: IMPRESSION: 1. MRI enterography findings suggest mild active Crohn enteritis involving a segment of jejunum in the left abdomen. No evidence of bowel obstruction,  fistula or abscess. 2. Mild wall thickening in the terminal ileum without evidence of active terminal ileitis. 3. Liver hemangiomas   Fecal calpro 11/17/18 - 59  MRE 02/19/2021:IMPRESSION: 1. Although sequences are somewhat limited by breath and bowel motion artifact throughout, findings again suggest some inflammatory involvement of the proximal small bowel in the left hemiabdomen, with wall thickening and some mucosal hyperenhancement. No inflammatory findings of the distal small bowel or colon. Given technical  limitations of this examination, CT enterography is more robust to motion artifact and may be helpful to further evaluate. 2. No evidence of complicating stricture, obstruction, fistula, or abscess. 3. Sigmoid diverticulosis.  Repeat fecal calprotectin 01/06/21 - 35   Past Medical History:  Diagnosis Date   Arthritis    Crohn's ileitis ([facility])    followed by dr Havery Moros--  mild crohn's ileitis (distal small bowel)   Endometrial polyp    GERD (gastroesophageal reflux disease)    History of chronic gastritis    History of gastric ulcer    History of kidney stones    Ovarian mass    PONV (postoperative nausea and vomiting)    Seasonal allergies      Past Surgical History:  Procedure Laterality Date   CYSTOSCOPY/RETROGRADE/URETEROSCOPY/STONE EXTRACTION WITH BASKET  x4  last one 2014 approx.   DILATATION & CURETTAGE/HYSTEROSCOPY WITH MYOSURE N/A 07/12/2017   Procedure: [place];  Surgeon: Anastasio Auerbach, MD;  Location: [facility];  Service: Gynecology;  Laterality: N/A;   KIDNEY SURGERY Left 45 (age 23)   per pt " repaired blood flow to kidney"   LAPAROSCOPIC BILATERAL SALPINGO OOPHERECTOMY Bilateral 07/12/2017   Procedure: LAPAROSCOPIC BILATERAL SALPINGO OOPHORECTOMY, RIGHT OVARIAN CYSTECTOMY, PELVIC WASHINGS;  Surgeon: Anastasio Auerbach, MD;  Location: [facility];  Service: Gynecology;  Laterality: Bilateral;   MICROLARYNGOSCOPY WITH CO2 LASER AND EXCISION OF VOCAL CORD LESION  2004   nodules removed (per pt benign)   NASAL SINUS SURGERY  2010   TONSILLECTOMY AND ADENOIDECTOMY  age 55   Family History  Problem Relation Age of Onset   Breast cancer Mother 20   Clotting disorder Mother    Diabetes Mother    Liver disease Mother    Bladder Cancer Father    Colon polyps Brother    Stomach cancer Neg Hx    Colon cancer Neg Hx    Esophageal cancer Neg Hx    Pancreatic cancer Neg Hx    Social  History   Tobacco Use   Smoking status: Former    Years: 2.00    Types: Cigarettes    Quit date: 07/05/1976    Years since quitting: 44.7   Smokeless tobacco: Never   Tobacco comments:    in high school  Vaping Use   Vaping Use: Never used  Substance Use Topics   Alcohol use: No   Drug use: No   Current Outpatient Medications  Medication Sig Dispense Refill   dicyclomine (BENTYL) 10 MG capsule Take 1-2 capsules (10-20 mg total) by mouth every 6 (six) hours as needed for spasms. 90 capsule 4   omeprazole (PRILOSEC) 40 MG capsule Take 1 capsule (40 mg total) by mouth daily. 90 capsule 3   No current facility-administered medications for this visit.   Allergies  Allergen Reactions   Codeine Nausea And Vomiting     Review of Systems: All systems reviewed and negative except where noted in HPI.   Lab Results  Component Value Date  WBC 3.7 (L) 01/05/2021   HGB 13.2 01/05/2021   HCT 39.8 01/05/2021   MCV 91.6 01/05/2021   PLT 215.0 01/05/2021    Lab Results  Component Value Date   CREATININE 0.92 01/05/2021   BUN 14 01/05/2021   NA 140 01/05/2021   K 4.6 01/05/2021   CL 105 01/05/2021   CO2 29 01/05/2021    Lab Results  Component Value Date   ALT 15 01/05/2021   AST 20 01/05/2021   ALKPHOS 57 01/05/2021   BILITOT 0.7 01/05/2021      Physical Exam: BP (!) 146/70    Pulse 84    Ht 5\' 3"  (1.6 m)    Wt 165 lb (74.8 kg)    BMI 29.23 kg/m  Constitutional: Pleasant,well-developed, female in no acute distress. Abdominal: Soft, nondistended, nontender. There are no masses palpable.  Extremities: no edema Neurological: Alert and oriented to person place and time. Psychiatric: Normal mood and affect. Behavior is normal.   ASSESSMENT AND PLAN: 66 year old female here for reassessment of following:  Crohn's disease of the small bowel GERD  As above, history of small bowel Crohn's noted on capsule endoscopy and enterography studies in the past, with mild  ileitis on colonoscopy.  Her fecal calprotectin has not been too elevated in the past.  She states budesonide did not help her too much in the past although looking at prior notes that shows that she did have some benefit, she has had more benefit with steroids.  6-MP provided benefit in the past but she did not like taking it.  We discussed options at this point.  If Crohn's is mild and not causing too frequent symptoms we can just monitor this but she has been doing this for years and her symptoms appear more frequent and bother her routinely.  In this light, given symptoms are consistent with her symptoms she has had related to Crohn's in the past, I do think she warrants therapy at this point time.  We discussed therapy for small bowel Crohn's disease, outlined some options with her to include anti-TNF, Stelara, Entyvio etc.  I think Humira is a good choice for her after discussion of risks and benefits.  We discussed whether or not to combine with the 6-MP and she wants to hold off on combination therapy for now.  She is negative for QuantiFERON gold, but does need hepatitis B core antibody sent to make sure negative, once this is returned we can start Humira (she already has negative hepatitis B surface antigen).  Otherwise we will send her to the lab for B12 levels, TIBC ferritin, and vitamin D levels.  In discussion of risks of Humira, includes increased risk for infection.  Recommend vaccine to pneumonia, she is amenable to PCV 13 vaccination today.  Also recommend Shingrix vaccine when she can do that.  I recommend yearly flu shot and COVID-vaccine, she states she will not get the COVID-vaccine but understands why I am recommending it and recommendation to do it.  Hopefully we can start Humira in the next week or 2 and then I would like to see her back in 3 months for reassessment, if not sooner with any issues.  In the interim we will resume omeprazole 40 mg daily for GERD.  Plan: - lab today -  hepatitis B core antibody, B12, TIBC / ferritin, vitamin D - plan on starting Humira once hep B studies done - PCV13 vaccines today - Shingrix vaccine recommended, as is flu shot /  COVID vaccine - omeprazole 40mg  / day - follow up 3 months  I spent 45 minutes of time, including in depth chart review, face-to-face time with the patient, and documenting this encounter.  Jolly Mango, MD [facility]

## 2021-04-09 NOTE — Patient Instructions (Addendum)
If you are age 65 or older, your body mass index should be between 23-30. Your Body mass index is 29.23 kg/m. If this is out of the aforementioned range listed, please consider follow up with your Primary Care Provider.  If you are age 58 or younger, your body mass index should be between 19-25. Your Body mass index is 29.23 kg/m. If this is out of the aformentioned range listed, please consider follow up with your Primary Care Provider.   ________________________________________________________  The [facility] providers would like to encourage you to use [facility] to communicate with providers for non-urgent requests or questions.  Due to long hold times on the telephone, sending your provider a message by [facility] may be a faster and more efficient way to get a response.  Please allow 48 business hours for a response.  Please remember that this is for non-urgent requests.  _______________________________________________________   Please go to the lab in the basement of our building to have lab work done as you leave today. Hit "B" for basement when you get on the elevator.  When the doors open the lab is on your left.  We will call you with the results. Thank you.  We have sent the following medications to your pharmacy for you to pick up at your convenience: Omeprazole 40 mg: Take once daily   Please get the Shingrix vaccine at your local pharmacy.  You can call [facility] on  [address] to schedule an appointment if you want: [phone].  We are giving you the Pneumococcal Prevnar 13 vaccine today.  Please follow up in 3 months.  Thank you for entrusting me with your care and for choosing [facility], Dr. Arcola Cellar

## 2021-04-10 LAB — HEPATITIS B CORE ANTIBODY, TOTAL: Hep B Core Total Ab: NONREACTIVE

## 2021-04-13 ENCOUNTER — Telehealth: Payer: Self-pay

## 2021-04-13 DIAGNOSIS — R109 Unspecified abdominal pain: Secondary | ICD-10-CM

## 2021-04-13 DIAGNOSIS — R197 Diarrhea, unspecified: Secondary | ICD-10-CM

## 2021-04-13 DIAGNOSIS — K5000 Crohn's disease of small intestine without complications: Secondary | ICD-10-CM

## 2021-04-13 MED ORDER — HUMIRA-CD/UC/HS STARTER 80 MG/0.8ML ~~LOC~~ AJKT: AUTO-INJECTOR | SUBCUTANEOUS | 0 refills | Status: DC

## 2021-04-13 MED ORDER — HUMIRA (2 PEN) 40 MG/0.4ML ~~LOC~~ AJKT: 1.0000 "pen " | AUTO-INJECTOR | SUBCUTANEOUS | 6 refills | Status: DC

## 2021-04-13 NOTE — Telephone Encounter (Signed)
Lm on vm for patient to return call.  Office recall in epic.  Humira starter kit and maintenance dose sent to [employer]. Pt has been enrolled in Careers adviser program via [employer]. They will contact pt to instruct on how to administer.

## 2021-04-13 NOTE — Telephone Encounter (Signed)
PA initiated via CMM for Humira starter kit. Will await response from insurance company. CMM Key: [id number] - PA Case ID: [id number]  Called patient's insurance to confirm specialty pharmacy. Spoke with Antony Odea, she states that the specialty pharmacy is [facility].  [address] [place] [address] Mamie [phone], F: [phone]).

## 2021-04-13 NOTE — Addendum Note (Signed)
Addended by: Yevette Edwards on: 04/13/2021 12:23 PM   Modules accepted: Orders

## 2021-04-13 NOTE — Telephone Encounter (Signed)
PA Case: [id number], Status: Approved, Coverage Starts on: 04/13/2021 12:00:00 AM, Coverage Ends on: 04/13/2022 12:00:00 AM.

## 2021-04-13 NOTE — Addendum Note (Signed)
Addended by: Yevette Edwards on: 04/13/2021 11:40 AM   Modules accepted: Orders

## 2021-04-13 NOTE — Telephone Encounter (Signed)
-----  Message from Steven P Armbruster, MD sent at 04/13/2021  7:58 AM EST ----- ° this patient tested negative for hepatitis B and TB. She is cleared to start Humira. °Can you order this for her, she will need starting kit / loading dose protocol, and need teaching on how to administer it. °I want to see her back in 3-4 months or so for reassessment after starting this. °I hope she otherwise got my messages about low B12 and vitamin D, needs to supplement those as I previously recommended in Mychart message. thanks  °

## 2021-04-13 NOTE — Telephone Encounter (Signed)
Pt returned call. We reviewed the remainder of her lab results. She is aware that her insurance has approved the [employer] and it has been sent to [employer]. Pt knows that I have enrolled her in the Humira nurse ambassador program and they will be contacting her within the next couple of days to review how to inject and answer any questions that she has about the medication. Pt did receive her other labs and has started her supplements. She knows that she will receive a letter closer to may so that we can schedule her for a f/u appt. Pt verbalized understanding of all information and had no concerns at the end of the call.

## 2021-06-30 ENCOUNTER — Encounter: Payer: Self-pay | Admitting: Nurse Practitioner

## 2021-07-08 NOTE — Telephone Encounter (Signed)
Returned call to patient. We reviewed Dr. Doyne Keel recommendations. Pt states that she will come by on Friday for labs and to pick up the stool kit. Pt has been advised to continue her Humira until Dr. Havery Moros further advises about her results. Pt verbalized understanding and had no concerns at the end of the call.  Orders in epic.

## 2021-07-08 NOTE — Addendum Note (Signed)
Addended by: Yevette Edwards on: 07/08/2021 01:24 PM   Modules accepted: Orders

## 2021-07-08 NOTE — Telephone Encounter (Signed)
Called and spoke with patient. She states that she has been on Humira for 2 months now, it will be 3 months on Monday (next injection due). Pt states that she has not seen any improvement in her symptoms since starting Humira, she states that she actually feels worse. Pt reports that she has tremendous gas, right sided abdominal pain, bloating, and diarrhea. Pt reports that her stools vary from loose stools to watery diarrhea. Pt reports that during the day she will have about 2 BM's, but during the night she will be up 2-3 times a night. No blood in the stool. She takes Dicyclomine 20 mg PRN for the abdominal pain, but states that it only helps some. Pt is concerned that she is not getting an effective response from the Humira. She has a follow up scheduled with you on Monday, 07/27/21 at 8:10 am. Any recommendations? Please advise, thanks

## 2021-07-08 NOTE — Telephone Encounter (Signed)
Sorry to hear this.  Can you ask her to go to the lab for CBC, B12 level, CRP, c-Met.  Can you ask her to go to the lab for C. difficile PCR.  We will await her labs and stool studies with further recommendations.  Thanks 

## 2021-07-08 NOTE — Telephone Encounter (Signed)
Patient called requesting to speak with nurse regarding Humira medication. Please advise.

## 2021-07-10 ENCOUNTER — Other Ambulatory Visit (INDEPENDENT_AMBULATORY_CARE_PROVIDER_SITE_OTHER): Payer: BC Managed Care – PPO

## 2021-07-10 DIAGNOSIS — R109 Unspecified abdominal pain: Secondary | ICD-10-CM | POA: Diagnosis not present

## 2021-07-10 DIAGNOSIS — R197 Diarrhea, unspecified: Secondary | ICD-10-CM | POA: Diagnosis not present

## 2021-07-10 DIAGNOSIS — K5000 Crohn's disease of small intestine without complications: Secondary | ICD-10-CM

## 2021-07-10 LAB — COMPREHENSIVE METABOLIC PANEL
ALT: 16 U/L (ref 0–35)
AST: 19 U/L (ref 0–37)
Albumin: 4.3 g/dL (ref 3.5–5.2)
Alkaline Phosphatase: 52 U/L (ref 39–117)
BUN: 21 mg/dL (ref 6–23)
CO2: 27 mEq/L (ref 19–32)
Calcium: 9.4 mg/dL (ref 8.4–10.5)
Chloride: 104 mEq/L (ref 96–112)
Creatinine, Ser: 1.16 mg/dL (ref 0.40–1.20)
GFR: 49.75 mL/min — ABNORMAL LOW (ref >60.00)
Glucose, Bld: 95 mg/dL (ref 70–99)
Potassium: 3.9 mEq/L (ref 3.5–5.1)
Sodium: 139 mEq/L (ref 135–145)
Total Bilirubin: 0.5 mg/dL (ref 0.2–1.2)
Total Protein: 7.1 g/dL (ref 6.0–8.3)

## 2021-07-10 LAB — CBC
HCT: 38.9 % (ref 36.0–46.0)
Hemoglobin: 13 g/dL (ref 12.0–15.0)
MCHC: 33.4 g/dL (ref 30.0–36.0)
MCV: 91.2 fl (ref 78.0–100.0)
Platelets: 202 10*3/uL (ref 150.0–400.0)
RBC: 4.27 Mil/uL (ref 3.87–5.11)
RDW: 13 % (ref 11.5–15.5)
WBC: 5.1 10*3/uL (ref 4.0–10.5)

## 2021-07-10 LAB — VITAMIN B12: Vitamin B-12: 525 pg/mL (ref 211–911)

## 2021-07-10 LAB — C-REACTIVE PROTEIN: CRP: <1 mg/dL (ref 0.5–20.0)

## 2021-07-15 ENCOUNTER — Other Ambulatory Visit: Payer: BC Managed Care – PPO

## 2021-07-15 DIAGNOSIS — K5000 Crohn's disease of small intestine without complications: Secondary | ICD-10-CM

## 2021-07-15 DIAGNOSIS — R109 Unspecified abdominal pain: Secondary | ICD-10-CM

## 2021-07-15 DIAGNOSIS — R197 Diarrhea, unspecified: Secondary | ICD-10-CM

## 2021-07-18 LAB — CLOSTRIDIUM DIFFICILE BY PCR: Toxigenic C. Difficile by PCR: NEGATIVE

## 2021-07-27 ENCOUNTER — Ambulatory Visit: Payer: BC Managed Care – PPO | Admitting: Gastroenterology

## 2021-09-07 ENCOUNTER — Encounter: Payer: Self-pay | Admitting: Gastroenterology

## 2021-09-15 ENCOUNTER — Ambulatory Visit: Payer: BC Managed Care – PPO | Admitting: Gastroenterology

## 2021-09-15 NOTE — Progress Notes (Deleted)
HPI :  66 year old female here for follow-up visit   Crohn's history: Diagnosed late 2012 with colonoscopy with erosions in the terminal ileum with biopsy showing granulomas. She had a Capsule study late 2012 with findings of distal ileal ulcerations, erosions and colitis. He had a good response to steroids initially. Eventually changed to 6-MP with good results. Stopped it and had a flare, took again for a period of time and had stopped it when she represented to our office in 11/2016.  Colonoscopy 12/2016 with 2 small erosions in TI but biopsies normal. Capsule endoscopy with mild small bowel Crohn's disease. She was asymptomatic at the time and she elected to monitor   SINCE LAST VISIT   66 year old female here for follow-up visit to discuss her Crohn's disease. I last saw her in September 2020 and she was last seen in the office by Nicoletta Ba in November 2022.   Recall she has had small bowel Crohn's disease diagnosed with imaging, capsule endoscopy, colonoscopy in years past.  She has elected for observation for the most part.  Her symptoms appear to be right-sided abdominal pain and bloating that bothers her.  Over time it appears to be getting worse, can fluctuate over times, she has never had a severe flare leading to hospitalization or bowel obstruction etc. however it does bother her.  She can function with it however.  She denies any NSAID use.  No nausea or vomiting or obstructive symptoms.  She does have fatigue.  Eating does seem to make the pain worse.  She has been trying to lose weight however, lost about 10 pounds with change in diet.  Her father was recently diagnosed with bile duct cancer who is not doing well and she is anxious about that.  She seldom has formed stools, these are usually loose.  Denies any blood in the stool but does have some blood noted on the toilet paper.  We have given her a course of budesonide in the past which did not really help her too much.  She has  had prednisone in recent years which has helped her symptoms, she has never really tried biologic therapy.  She was on 6-MP remotely a few years ago which did appear to help her symptoms but she did not like taking it and stopped it.   We had a CT enterography performed in March 2019 which did not show any active small bowel Crohn's disease.  She had a suggestion of a fullness in the right ovary which led to a pelvic ultrasound showing a suspected ovarian mass.  She was then referred to gynecology and had resection which she was told was benign.   She had an MRE in 11/2018 and again in 02/20/21 as outlined below, both showing small bowel inflammation.   Of note the patient otherwise has a history of reflux, has been bothering her more so lately.  Needs refill of omeprazole 40 mg which works well for her.   Most recent workup: Colonoscopy 12/21/2016 - 2 small erosions in the TI, sigmoid diverticulosis, normal colon otherwise - biopsies were normal. Colon biopsies normal.    Capsule endoscopy 01/17/2017 - gastritis, 2 ulcerations seen in the distal small bowel - suggestive of mild Crohn's disease   CT enterography 04/28/2017 - no active small bowel disease, suggestion of fullness of the right ovary     MRE 12/11/2018: IMPRESSION: 1. MRI enterography findings suggest mild active Crohn enteritis involving a segment of jejunum in the left abdomen.  No evidence of bowel obstruction, fistula or abscess. 2. Mild wall thickening in the terminal ileum without evidence of active terminal ileitis. 3. Liver hemangiomas   Fecal calpro 11/17/18 - 59   MRE 02/19/2021:IMPRESSION: 1. Although sequences are somewhat limited by breath and bowel motion artifact throughout, findings again suggest some inflammatory involvement of the proximal small bowel in the left hemiabdomen, with wall thickening and some mucosal hyperenhancement. No inflammatory findings of the distal small bowel or colon. Given technical  limitations of this examination, CT enterography is more robust to motion artifact and may be helpful to further evaluate. 2. No evidence of complicating stricture, obstruction, fistula, or abscess. 3. Sigmoid diverticulosis.   Repeat fecal calprotectin 01/06/21 - 37    66 year old female here for reassessment of following:   Crohn's disease of the small bowel GERD   As above, history of small bowel Crohn's noted on capsule endoscopy and enterography studies in the past, with mild ileitis on colonoscopy.  Her fecal calprotectin has not been too elevated in the past.  She states budesonide did not help her too much in the past although looking at prior notes that shows that she did have some benefit, she has had more benefit with steroids.  6-MP provided benefit in the past but she did not like taking it.   We discussed options at this point.  If Crohn's is mild and not causing too frequent symptoms we can just monitor this but she has been doing this for years and her symptoms appear more frequent and bother her routinely.  In this light, given symptoms are consistent with her symptoms she has had related to Crohn's in the past, I do think she warrants therapy at this point time.  We discussed therapy for small bowel Crohn's disease, outlined some options with her to include anti-TNF, Stelara, Entyvio etc.  I think Humira is a good choice for her after discussion of risks and benefits.  We discussed whether or not to combine with the 6-MP and she wants to hold off on combination therapy for now.  She is negative for QuantiFERON gold, but does need hepatitis B core antibody sent to make sure negative, once this is returned we can start Humira (she already has negative hepatitis B surface antigen).  Otherwise we will send her to the lab for B12 levels, TIBC ferritin, and vitamin D levels.  In discussion of risks of Humira, includes increased risk for infection.  Recommend vaccine to pneumonia, she is  amenable to PCV 13 vaccination today.  Also recommend Shingrix vaccine when she can do that.  I recommend yearly flu shot and COVID-vaccine, she states she will not get the COVID-vaccine but understands why I am recommending it and recommendation to do it.  Hopefully we can start Humira in the next week or 2 and then I would like to see her back in 3 months for reassessment, if not sooner with any issues.  In the interim we will resume omeprazole 40 mg daily for GERD.   Plan: - lab today - hepatitis B core antibody, B12, TIBC / ferritin, vitamin D - plan on starting Humira once hep B studies done - PCV13 vaccines today - Shingrix vaccine recommended, as is flu shot / COVID vaccine - omeprazole 32m / day - follow up 3 months   I spent 45 minutes of time, including in depth chart review, face-to-face time with the patient, and documenting this encounter.    Started Humira - felt worse?  In May C diff negative, labs done showing Hgb 13.0, MCV 91, WBC 5.1, B12 525, CRP undetectable, CMET normal.  Previously had vitamin D and B12 deficiency      Past Medical History:  Diagnosis Date   Arthritis    Crohn's ileitis ([place])    followed by dr Havery Moros--  mild crohn's ileitis (distal small bowel)   Endometrial polyp    GERD (gastroesophageal reflux disease)    History of chronic gastritis    History of gastric ulcer    History of kidney stones    Ovarian mass    PONV (postoperative nausea and vomiting)    Seasonal allergies      Past Surgical History:  Procedure Laterality Date   CYSTOSCOPY/RETROGRADE/URETEROSCOPY/STONE EXTRACTION WITH BASKET  x4  last one 2014 approx.   DILATATION & CURETTAGE/HYSTEROSCOPY WITH MYOSURE N/A 07/12/2017   Procedure: [place];  Surgeon: Anastasio Auerbach, MD;  Location: [facility];  Service: Gynecology;  Laterality: N/A;   KIDNEY SURGERY Left 61 (age 14)   per pt " repaired blood flow to  kidney"   LAPAROSCOPIC BILATERAL SALPINGO OOPHERECTOMY Bilateral 07/12/2017   Procedure: LAPAROSCOPIC BILATERAL SALPINGO OOPHORECTOMY, RIGHT OVARIAN CYSTECTOMY, PELVIC WASHINGS;  Surgeon: Anastasio Auerbach, MD;  Location: [facility];  Service: Gynecology;  Laterality: Bilateral;   MICROLARYNGOSCOPY WITH CO2 LASER AND EXCISION OF VOCAL CORD LESION  2004   nodules removed (per pt benign)   NASAL SINUS SURGERY  2010   TONSILLECTOMY AND ADENOIDECTOMY  age 75   Family History  Problem Relation Age of Onset   Breast cancer Mother 72   Clotting disorder Mother    Diabetes Mother    Liver disease Mother    Bladder Cancer Father    Colon polyps Brother    Stomach cancer Neg Hx    Colon cancer Neg Hx    Esophageal cancer Neg Hx    Pancreatic cancer Neg Hx    Social History   Tobacco Use   Smoking status: Former    Years: 2.00    Types: Cigarettes    Quit date: 07/05/1976    Years since quitting: 45.2   Smokeless tobacco: Never   Tobacco comments:    in high school  Vaping Use   Vaping Use: Never used  Substance Use Topics   Alcohol use: No   Drug use: No   Current Outpatient Medications  Medication Sig Dispense Refill   Adalimumab (HUMIRA PEN) 40 MG/0.4ML PNKT Inject 1 pen into the skin every 14 (fourteen) days. 2 each 6   Adalimumab (HUMIRA PEN-CD/UC/HS STARTER) 80 MG/0.8ML PNKT Inject 2 pens (160 mg total) under the skin on day 1, inject 1 pen (80 mg total) under the skin on day 15 3 each 0   dicyclomine (BENTYL) 10 MG capsule Take 1-2 capsules (10-20 mg total) by mouth every 6 (six) hours as needed for spasms. 90 capsule 4   omeprazole (PRILOSEC) 40 MG capsule Take 1 capsule (40 mg total) by mouth daily. 90 capsule 3   No current facility-administered medications for this visit.   Allergies  Allergen Reactions   Codeine Nausea And Vomiting     Review of Systems: All systems reviewed and negative except where noted in HPI.    No results  found.  Physical Exam: There were no vitals taken for this visit. Constitutional: Pleasant,well-developed, ***female in no acute distress. HEENT: Normocephalic and atraumatic. Conjunctivae are normal. No scleral icterus. Neck supple.  Cardiovascular: Normal rate, regular rhythm.  Pulmonary/chest: Effort normal and breath sounds normal. No wheezing, rales or rhonchi. Abdominal: Soft, nondistended, nontender. Bowel sounds active throughout. There are no masses palpable. No hepatomegaly. Extremities: no edema Lymphadenopathy: No cervical adenopathy noted. Neurological: Alert and oriented to person place and time. Skin: Skin is warm and dry. No rashes noted. Psychiatric: Normal mood and affect. Behavior is normal.   ASSESSMENT: 66 y.o. female here for assessment of the following  No diagnosis found.  PLAN:   Kennieth Rad, MD

## 2021-10-13 ENCOUNTER — Encounter: Payer: Self-pay | Admitting: Physician Assistant

## 2021-10-13 ENCOUNTER — Ambulatory Visit: Payer: BC Managed Care – PPO | Admitting: Physician Assistant

## 2021-10-13 VITALS — BP 112/74 | HR 72 | Ht 63.0 in | Wt 174.4 lb

## 2021-10-13 DIAGNOSIS — R198 Other specified symptoms and signs involving the digestive system and abdomen: Secondary | ICD-10-CM | POA: Diagnosis not present

## 2021-10-13 DIAGNOSIS — R1011 Right upper quadrant pain: Secondary | ICD-10-CM

## 2021-10-13 DIAGNOSIS — K5000 Crohn's disease of small intestine without complications: Secondary | ICD-10-CM

## 2021-10-13 DIAGNOSIS — K219 Gastro-esophageal reflux disease without esophagitis: Secondary | ICD-10-CM | POA: Diagnosis not present

## 2021-10-13 MED ORDER — DICYCLOMINE HCL 10 MG PO CAPS
10.0000 mg | ORAL_CAPSULE | Freq: Four times a day (QID) | ORAL | 6 refills | Status: DC | PRN
Start: 2021-10-13 — End: 2023-08-16

## 2021-10-13 MED ORDER — OMEPRAZOLE 40 MG PO CPDR: 40.0000 mg | DELAYED_RELEASE_CAPSULE | Freq: Every day | ORAL | 3 refills | Status: DC

## 2021-10-13 MED ORDER — NA SULFATE-K SULFATE-MG SULF 17.5-3.13-1.6 GM/177ML PO SOLN: 1.0000 | Freq: Once | ORAL | 0 refills | Status: AC

## 2021-10-13 MED ORDER — FAMOTIDINE 20 MG PO TABS: 20.0000 mg | ORAL_TABLET | Freq: Every day | ORAL | 6 refills | Status: DC

## 2021-10-13 NOTE — Progress Notes (Signed)
Subjective:    Patient ID: Alyssa Strong, female    DOB: [birth year], 66 y.o.   MRN: [id number]  HPI Alyssa Strong is a pleasant 66 year old female, established with Dr. Havery Moros.  She has diagnosis of Crohn's enteritis, initially diagnosed in 2012, and had initial good response to steroids.  Eventually had been treated with 6-MP with response.  She stopped it, did have a flare and then took it for short period of time again and had come off of it when she had been seen here in October 2018. Repeat evaluation in November 2018 with colonoscopy showed 2 small erosions in the terminal ileum however biopsies were normal and capsule endoscopy was done with findings of 2 small ulcers in the distal small bowel consistent with very mild Crohn's disease.  At that time she was not having symptoms and elected to monitor.  He was last seen in February 2023, had had some progressive right-sided abdominal pain and bloating, and relates alternating bowel habits with constipation and then episodes of abrupt diarrhea.  She had MR enterography done in January 2023 with suggestion of some inflammatory changes of the proximal small bowel in the left hemiabdomen with wall thickening there was no inflammation noted distally. Decision was made to start her on Humira.  She says she took the Humira for about 3 months, then stopped it as she did not note any response to it and was having side effects of dizziness and headaches. Since that time she has continued to have some right-sided abdominal discomfort, bloating and her chronic issues with alternating bowel habits with episodes of constipation, and then what she describes as "blowouts" of diarrhea.  She has not had any rectal bleeding.  She describes her abdominal discomfort as a nagging discomfort in the right mid abdomen which is present most of the time, sometimes worse after eating.  She uses dicyclomine as needed which is helpful.   She is also concerned about persistent GERD  symptoms despite taking daily 40 mg of omeprazole.  She says most days particularly in the evening she will have some heartburn and indigestion and winds up taking Tums.  She is also having some mild dysphagia type symptoms with a sensation of food sitting "" at times.  She would like to have repeat colonoscopy and EGD at this time, is concerned about a change in her insurance as she is turning 66 within the next couple of weeks and will be switching over to Medicare.  Most recent labs had been done May 2023 with C. difficile negative, CRP less than 10 CBC within normal limits, creatinine 1.16 B12 525 LFTs within normal limits Fecal calprotectin in November 2022 was normal.  Review of Systems. Pertinent positive and negative review of systems were noted in the above HPI section.  All other review of systems was otherwise negative.   Outpatient Encounter Medications as of 10/13/2021  Medication Sig   famotidine (PEPCID) 20 MG tablet Take 1 tablet (20 mg total) by mouth at bedtime.   Na Sulfate-K Sulfate-Mg Sulf 17.5-3.13-1.6 GM/177ML SOLN Take 1 kit by mouth once for 1 dose.   [DISCONTINUED] dicyclomine (BENTYL) 10 MG capsule Take 1-2 capsules (10-20 mg total) by mouth every 6 (six) hours as needed for spasms.   [DISCONTINUED] omeprazole (PRILOSEC) 40 MG capsule Take 1 capsule (40 mg total) by mouth daily.   dicyclomine (BENTYL) 10 MG capsule Take 1-2 capsules (10-20 mg total) by mouth every 6 (six) hours as needed for spasms.   omeprazole (  PRILOSEC) 40 MG capsule Take 1 capsule (40 mg total) by mouth daily.   [DISCONTINUED] Adalimumab (HUMIRA PEN) 40 MG/0.4ML PNKT Inject 1 pen into the skin every 14 (fourteen) days.   [DISCONTINUED] Adalimumab (HUMIRA PEN-CD/UC/HS STARTER) 80 MG/0.8ML PNKT Inject 2 pens (160 mg total) under the skin on day 1, inject 1 pen (80 mg total) under the skin on day 15   No facility-administered encounter medications on file as of 10/13/2021.   Allergies  Allergen  Reactions   Codeine Nausea And Vomiting   There are no problems to display for this patient.  Social History   Socioeconomic History   Marital status: Married    Spouse name: Not on file   Number of children: 1   Years of education: Not on file   Highest education level: Not on file  Occupational History   Occupation: Administrative Assist  Tobacco Use   Smoking status: Former    Years: 2.00    Types: Cigarettes    Quit date: 07/05/1976    Years since quitting: 45.3   Smokeless tobacco: Never   Tobacco comments:    in high school  Vaping Use   Vaping Use: Never used  Substance and Sexual Activity   Alcohol use: No   Drug use: No   Sexual activity: Yes    Partners: Male    Birth control/protection: Post-menopausal  Other Topics Concern   Not on file  Social History Narrative   Daughter is a PA at [employer] Determinants of Health   Financial Resource Strain: Not on file  Food Insecurity: Not on file  Transportation Needs: Not on file  Physical Activity: Not on file  Stress: Not on file  Social Connections: Not on file  Intimate Partner Violence: Not on file    Alyssa Strong's family history includes Bladder Cancer in her father; Breast cancer (age of onset: 60) in her mother; Clotting disorder in her mother; Colon polyps in her brother; Diabetes in her mother; Liver disease in her mother.      Objective:    Vitals:   10/13/21 1012  BP: 112/74  Pulse: 72    Physical Exam Well-developed well-nourished older white female in no acute distress.  Height, Weight, 174 BMI 30.89  HEENT; nontraumatic normocephalic, EOMI, PE R LA, sclera anicteric. Oropharynx; not examined today Neck; supple, no JVD Cardiovascular; regular rate and rhythm with S1-S2, no murmur rub or gallop Pulmonary; Clear bilaterally Abdomen; soft, there is mild tenderness in the right mid quadrant, nondistended, no palpable mass or hepatosplenomegaly, bowel sounds are active Rectal; not  done today Skin; benign exam, no jaundice rash or appreciable lesions Extremities; no clubbing cyanosis or edema skin warm and dry Neuro/Psych; alert and oriented x4, grossly nonfocal mood and affect appropriate        Assessment & Plan:   #61 66 year old white female with history of Crohn's involving the small intestine, initially diagnosed 2012 and had been treated for a short period of time with steroids and then 6-MP. He presented here in 2018 Colonoscopy November 2018 with finding of 2 small erosions in the terminal ileum however biopsies were unremarkable. Capsule endoscopy December 2018 with 2 small ulcers in the distal small bowel consistent with mild Crohn's. She was asymptomatic at that time and opted to observe  Patient was seen February 2023 with right-sided abdominal pain and bloating and ongoing issues with alternating bowel habits Decision made to start Humira, after prebiologic testing and immunizations done.  Had a normal fecal calprotectin.  Patient completed 3 months of Humira then stopped it as she did not notice any improvement in symptoms and was having side effects with dizziness and headaches  No change in symptoms since that time with ongoing nagging right mid abdominal discomfort, alternating bouts of constipation and diarrhea  #2 GERD currently poorly controlled on omeprazole 40 mg p.o. every morning Vague dysphagia complaints  Plan; continue omeprazole 40 mg p.o. every morning AC breakfast Add Pepcid 20 mg p.o. every afternoon AC dinner Refill dicyclomine 10 mg every 6 hours as needed Patient will be scheduled for colonoscopy and EGD with Dr. Havery Moros.  Both procedures were discussed in detail with the patient including indications risk and benefits and she is agreeable to proceed. Further recommendations regarding management pending results at endoscopic evaluation.  Alfredia Ferguson PA-C 10/13/2021   Cc: Kennieth Rad, MD

## 2021-10-13 NOTE — Patient Instructions (Signed)
If you are age 65 or younger, your body mass index should be between 19-25. Your Body mass index is 30.89 kg/m. If this is out of the aformentioned range listed, please consider follow up with your Primary Care Provider.  ________________________________________________________  The [facility] providers would like to encourage you to use [facility] to communicate with providers for non-urgent requests or questions.  Due to long hold times on the telephone, sending your provider a message by [facility] may be a faster and more efficient way to get a response.  Please allow 48 business hours for a response.  Please remember that this is for non-urgent requests.  _______________________________________________________  Alyssa Strong have been scheduled for an endoscopy and colonoscopy. Please follow the written instructions given to you at your visit today. Please pick up your prep supplies at the pharmacy within the next 1-3 days. If you use inhalers (even only as needed), please bring them with you on the day of your procedure.  Refills of Dicyclomine and Omeprazole have been sent to your pharmacy START Famotidine 20 mg 1 before dinner  Follow up pending  Thank you for entrusting me with your care and choosing [facility].  Amy Esterwood, PA-C

## 2021-10-13 NOTE — Progress Notes (Signed)
Agree with assessment and plan as outlined.  Unclear if she may be a primary nonresponder to anti-TNF/Humira.  She does appear to have an accurate diagnosis of Crohn's in the past.  We will await her colonoscopy and if any significant inflammation we will need to consider starting a different biologic, can discuss options with her.

## 2021-10-30 ENCOUNTER — Ambulatory Visit (AMBULATORY_SURGERY_CENTER): Payer: Medicare Other | Admitting: Gastroenterology

## 2021-10-30 ENCOUNTER — Encounter: Payer: Self-pay | Admitting: Gastroenterology

## 2021-10-30 VITALS — BP 121/74 | HR 60 | Temp 97.5°F | Resp 10 | Ht 63.0 in | Wt 174.0 lb

## 2021-10-30 DIAGNOSIS — K2289 Other specified disease of esophagus: Secondary | ICD-10-CM | POA: Diagnosis not present

## 2021-10-30 DIAGNOSIS — K529 Noninfective gastroenteritis and colitis, unspecified: Secondary | ICD-10-CM | POA: Diagnosis not present

## 2021-10-30 DIAGNOSIS — K449 Diaphragmatic hernia without obstruction or gangrene: Secondary | ICD-10-CM | POA: Diagnosis not present

## 2021-10-30 DIAGNOSIS — K5000 Crohn's disease of small intestine without complications: Secondary | ICD-10-CM

## 2021-10-30 DIAGNOSIS — K219 Gastro-esophageal reflux disease without esophagitis: Secondary | ICD-10-CM | POA: Diagnosis not present

## 2021-10-30 DIAGNOSIS — K5730 Diverticulosis of large intestine without perforation or abscess without bleeding: Secondary | ICD-10-CM | POA: Diagnosis not present

## 2021-10-30 DIAGNOSIS — K6289 Other specified diseases of anus and rectum: Secondary | ICD-10-CM

## 2021-10-30 DIAGNOSIS — K5900 Constipation, unspecified: Secondary | ICD-10-CM

## 2021-10-30 DIAGNOSIS — R197 Diarrhea, unspecified: Secondary | ICD-10-CM | POA: Diagnosis not present

## 2021-10-30 MED ORDER — SODIUM CHLORIDE 0.9 % IV SOLN: 500.0000 mL | Freq: Once | INTRAVENOUS | Status: DC

## 2021-10-30 NOTE — Progress Notes (Signed)
Called to room to assist during endoscopic procedure.  Patient ID and intended procedure confirmed with present staff. Received instructions for my participation in the procedure from the performing physician.  

## 2021-10-30 NOTE — Progress Notes (Signed)
Pt in recovery with monitors in place, VSS. Report given to receiving RN. Bite guard was placed with pt awake to ensure comfort. No dental or soft tissue damage noted.

## 2021-10-30 NOTE — Patient Instructions (Addendum)
Handouts on hiatal hernia given to patient. Await pathology results. Resume previous diet and continue present medications. May increase omeprazole to twice daily if symptoms persist, continue Pepcid if that has provided benefit Start Citrucel once a day to provide bowel regularity (over the counter) Trial of IB guard PRN (samples given)   YOU HAD AN ENDOSCOPIC PROCEDURE TODAY AT [facility]:   Refer to the procedure report that was given to you for any specific questions about what was found during the examination.  If the procedure report does not answer your questions, please call your gastroenterologist to clarify.  If you requested that your care partner not be given the details of your procedure findings, then the procedure report has been included in a sealed envelope for you to review at your convenience later.  YOU SHOULD EXPECT: Some feelings of bloating in the abdomen. Passage of more gas than usual.  Walking can help get rid of the air that was put into your GI tract during the procedure and reduce the bloating. If you had a lower endoscopy (such as a colonoscopy or flexible sigmoidoscopy) you may notice spotting of blood in your stool or on the toilet paper. If you underwent a bowel prep for your procedure, you may not have a normal bowel movement for a few days.  Please Note:  You might notice some irritation and congestion in your nose or some drainage.  This is from the oxygen used during your procedure.  There is no need for concern and it should clear up in a day or so.  SYMPTOMS TO REPORT IMMEDIATELY:  Following lower endoscopy (colonoscopy or flexible sigmoidoscopy):  Excessive amounts of blood in the stool  Significant tenderness or worsening of abdominal pains  Swelling of the abdomen that is new, acute  Fever of 100F or higher  Following upper endoscopy (EGD)  Vomiting of blood or coffee ground material  New chest pain or pain under the shoulder  blades  Painful or persistently difficult swallowing  New shortness of breath  Fever of 100F or higher  Black, tarry-looking stools  For urgent or emergent issues, a gastroenterologist can be reached at any hour by calling [phone]. Do not use MyChart messaging for urgent concerns.    DIET:  We do recommend a small meal at first, but then you may proceed to your regular diet.  Drink plenty of fluids but you should avoid alcoholic beverages for 24 hours.  ACTIVITY:  You should plan to take it easy for the rest of today and you should NOT DRIVE or use heavy machinery until tomorrow (because of the sedation medicines used during the test).    FOLLOW UP: Our staff will call the number listed on your records the next business day following your procedure.  We will call around 7:15- 8:00 am to check on you and address any questions or concerns that you may have regarding the information given to you following your procedure. If we do not reach you, we will leave a message.     If any biopsies were taken you will be contacted by phone or by letter within the next 1-3 weeks.  Please call us at [phone] if you have not heard about the biopsies in 3 weeks.    SIGNATURES/CONFIDENTIALITY: You and/or your care partner have signed paperwork which will be entered into your electronic medical record.  These signatures attest to the fact that that the information above on your After Visit  Summary has been reviewed and is understood.  Full responsibility of the confidentiality of this discharge information lies with you and/or your care-partner.

## 2021-10-30 NOTE — Progress Notes (Signed)
History and Physical Interval Note: Seen on 10/13/21 - no interval changes. EGD to further evaluate significant reflux symptoms, on omeprazole and pepcid, colonoscopy to reassess Crohn's disease. Failed Humira - not on meds, here for disease activity assessment. Discussed procedures she wishes to proceed.  10/30/2021 3:45 PM  Alyssa Strong  has presented today for endoscopic procedure(s), with the diagnosis of  Encounter Diagnoses  Name Primary?   Crohn's disease of small intestine without complication ([place]) Yes   Gastroesophageal reflux disease, unspecified whether esophagitis present   .  The various methods of evaluation and treatment have been discussed with the patient and/or family. After consideration of risks, benefits and other options for treatment, the patient has consented to  the endoscopic procedure(s).   The patient's history has been reviewed, patient examined, no change in status, stable for surgery.  I have reviewed the patient's chart and labs.  Questions were answered to the patient's satisfaction.    Jolly Mango, MD [facility]

## 2021-10-30 NOTE — Op Note (Signed)
[place] Patient Name: Alyssa Strong Procedure Date: 10/30/2021 3:54 PM MRN: [id number] Endoscopist: Remo Lipps P. Havery Alyssa Strong , MD Age: 66 Referring MD:  Date of Birth: February 21, 1955 Gender: Female Account #: [id number] Procedure:                Upper GI endoscopy Indications:              history of gastro-esophageal reflux disease - on                            omeprazole once daily with symptoms, recently added                            pepcid which has provided some benefit. Also with                            suspected small bowel Crohn's disease Medicines:                Monitored Anesthesia Care Procedure:                Pre-Anesthesia Assessment:                           - Prior to the procedure, a History and Physical                            was performed, and patient medications and                            allergies were reviewed. The patient's tolerance of                            previous anesthesia was also reviewed. The risks                            and benefits of the procedure and the sedation                            options and risks were discussed with the patient.                            All questions were answered, and informed consent                            was obtained. Prior Anticoagulants: The patient has                            taken no previous anticoagulant or antiplatelet                            agents. ASA Grade Assessment: II - A patient with                            mild systemic disease. After reviewing the risks  and benefits, the patient was deemed in                            satisfactory condition to undergo the procedure.                           After obtaining informed consent, the endoscope was                            passed under direct vision. Throughout the                            procedure, the patient's blood pressure, pulse, and                            oxygen  saturations were monitored continuously. The                            Endoscope was introduced through the mouth, and                            advanced to the second part of duodenum. The upper                            GI endoscopy was accomplished without difficulty.                            The patient tolerated the procedure well. Scope In: Scope Out: Findings:                 Esophagogastric landmarks were identified: the                            Z-line was found at 38 cm, the gastroesophageal                            junction was found at 38 cm and the upper extent of                            the gastric folds was found at 39 cm from the                            incisors.                           A 1 cm hiatal hernia was present.                           The exam of the esophagus was otherwise normal.                           Biopsies were taken with a cold forceps in the  upper third of the esophagus, in the middle third                            of the esophagus and in the lower third of the                            esophagus for histology to ensure no evidence of                            eosinophilic esophagitis.                           The entire examined stomach was normal.                           The examined duodenum was normal. Biopsies for                            histology were taken with a cold forceps for                            evaluation of celiac disease. Complications:            No immediate complications. Estimated blood loss:                            Minimal. Estimated Blood Loss:     Estimated blood loss was minimal. Impression:               - Esophagogastric landmarks identified.                           - 1 cm hiatal hernia.                           - Normal esophagus otherwise - biopsies taken to                            rule otu eosinophilic esophagitis                           - Normal  stomach.                           - Normal examined duodenum. Biopsied. Recommendation:           - Patient has a contact number available for                            emergencies. The signs and symptoms of potential                            delayed complications were discussed with the                            patient. Return to normal activities tomorrow.  Written discharge instructions were provided to the                            patient.                           - Resume previous diet.                           - Continue present medications.                           - Continue pepcid if that has provided benefit,                            consider omeprazole twice daily dosing if symptoms                            persist                           - Await pathology results. Remo Lipps P. Kaiyu Mirabal, MD 10/30/2021 4:41:54 PM This report has been signed electronically.

## 2021-10-30 NOTE — Op Note (Signed)
[place] Patient Name: Alyssa Strong Procedure Date: 10/30/2021 3:52 PM MRN: [id number] Endoscopist: Remo Lipps P. Havery Moros , MD Age: 66 Referring MD:  Date of Birth: 1955-11-02 Gender: Female Account #: [id number] Procedure:                Colonoscopy Indications:              Disease activity assessment of suspected Crohn's                            disease of the small bowel - prior colonoscopy,                            MRE, capsule have suggested Crohn's - trial of                            Humira did not provide benefit and had side                            effects, stopped. Having intermittent loose stools                            / constipation, abdominal discomfort Medicines:                Monitored Anesthesia Care Procedure:                Pre-Anesthesia Assessment:                           - Prior to the procedure, a History and Physical                            was performed, and patient medications and                            allergies were reviewed. The patient's tolerance of                            previous anesthesia was also reviewed. The risks                            and benefits of the procedure and the sedation                            options and risks were discussed with the patient.                            All questions were answered, and informed consent                            was obtained. Prior Anticoagulants: The patient has                            taken no previous anticoagulant or antiplatelet  agents. ASA Grade Assessment: II - A patient with                            mild systemic disease. After reviewing the risks                            and benefits, the patient was deemed in                            satisfactory condition to undergo the procedure.                           After obtaining informed consent, the colonoscope                            was passed under direct vision.  Throughout the                            procedure, the patient's blood pressure, pulse, and                            oxygen saturations were monitored continuously. The                            Olympus PCF-[id number] (#[id number]) Colonoscope was                            introduced through the anus and advanced to the the                            terminal ileum, with identification of the                            appendiceal orifice and IC valve. The colonoscopy                            was performed without difficulty. The patient                            tolerated the procedure well. The quality of the                            bowel preparation was good. The terminal ileum,                            ileocecal valve, appendiceal orifice, and rectum                            were photographed. Scope In: 4:11:26 PM Scope Out: 4:27:28 PM Scope Withdrawal Time: 0 hours 13 minutes 27 seconds  Total Procedure Duration: 0 hours 16 minutes 2 seconds  Findings:                 The perianal and digital rectal examinations were  normal.                           The terminal ileum contained a few small erosions.                            No significant inflammation. Biopsies were taken                            with a cold forceps for histology.                           A few small-mouthed diverticula were found in the                            sigmoid colon.                           Internal hemorrhoids were found during                            retroflexion. The hemorrhoids were small.                           Anal papilla(e) were hypertrophied.                           The exam was otherwise without abnormality.                           Biopsies for histology were taken with a cold                            forceps from the right colon, left colon and                            transverse colon for evaluation of microscopic                             colitis. Complications:            No immediate complications. Estimated blood loss:                            Minimal. Estimated Blood Loss:     Estimated blood loss was minimal. Impression:               - A few small erosions in the terminal ileum.                            Biopsied.                           - Diverticulosis in the sigmoid colon.                           - Internal hemorrhoids.                           -  Anal papilla(e) were hypertrophied.                           - The examination was otherwise normal.                           - Biopsies were taken with a cold forceps from the                            right colon, left colon and transverse colon for                            evaluation of microscopic colitis.                           While it's possible and likely the patient has                            small bowel Crohn's based on workup to date, seems                            quite mild, unclear if this is even causing                            symptoms vs. possible functional bowel disorder.                            Will discuss with the patient. Recommendation:           - Patient has a contact number available for                            emergencies. The signs and symptoms of potential                            delayed complications were discussed with the                            patient. Return to normal activities tomorrow.                            Written discharge instructions were provided to the                            patient.                           - Resume previous diet.                           - Continue present medications.                           - Start Citrucel once daily to provide bowel  regularity                           - Trial of IB gard PRN - will give samples                           - Await pathology results with further                             recommendations Remo Lipps P. Cline Draheim, MD 10/30/2021 4:38:53 PM This report has been signed electronically.

## 2021-11-02 ENCOUNTER — Telehealth: Payer: Self-pay | Admitting: *Deleted

## 2021-11-02 NOTE — Telephone Encounter (Signed)
  Follow up Call-     10/30/2021    2:35 PM  Call back number  Post procedure Call Back phone  # [phone]  Permission to leave phone message Yes     Patient questions:  Do you have a fever, pain , or abdominal swelling? No. Pain Score  0 *  Have you tolerated food without any problems? Yes.    Have you been able to return to your normal activities? Yes.    Do you have any questions about your discharge instructions: Diet   No. Medications  No. Follow up visit  No.  Do you have questions or concerns about your Care? No.  Actions: * If pain score is 4 or above: No action needed, pain <4.

## 2021-12-29 ENCOUNTER — Ambulatory Visit: Payer: Medicare Other | Admitting: Gastroenterology

## 2022-09-05 ENCOUNTER — Other Ambulatory Visit: Payer: Self-pay | Admitting: Physician Assistant

## 2022-11-11 ENCOUNTER — Other Ambulatory Visit (HOSPITAL_COMMUNITY)
Admission: RE | Admit: 2022-11-11 | Discharge: 2022-11-11 | Disposition: A | Discharge status: Home / Self Care | Payer: Medicare Other | Source: Ambulatory Visit | Attending: Obstetrics and Gynecology | Admitting: Obstetrics and Gynecology

## 2022-11-11 ENCOUNTER — Encounter: Payer: Self-pay | Admitting: Obstetrics and Gynecology

## 2022-11-11 ENCOUNTER — Ambulatory Visit (INDEPENDENT_AMBULATORY_CARE_PROVIDER_SITE_OTHER): Payer: Medicare Other | Admitting: Obstetrics and Gynecology

## 2022-11-11 VITALS — BP 114/76 | Ht 63.0 in | Wt 178.0 lb

## 2022-11-11 DIAGNOSIS — Z01419 Encounter for gynecological examination (general) (routine) without abnormal findings: Secondary | ICD-10-CM | POA: Diagnosis present

## 2022-11-11 DIAGNOSIS — Z124 Encounter for screening for malignant neoplasm of cervix: Secondary | ICD-10-CM

## 2022-11-11 NOTE — Addendum Note (Signed)
Addended by: Darrell Jewel V on: 11/11/2022 02:16 PM   Modules accepted: Orders

## 2022-11-11 NOTE — Progress Notes (Signed)
67 y.o. G1P1 female S/P BSO Had cyst and benign tumor of ovary (2019) here for annual exam.  Patient is postmenopausal. 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 Has one daughter who is a PA in [employer]. Sexually active: Yes.    No LMP recorded. Patient is postmenopausal.       Component Value Date/Time   DIAGPAP  06/20/2020 1633    - Negative for intraepithelial lesion or malignancy (NILM)   HPVHIGH Negative 06/20/2020 1633   ADEQPAP  06/20/2020 1633    Satisfactory for evaluation; transformation zone component PRESENT.    GYN HISTORY:    Component Value Date/Time   DIAGPAP  06/20/2020 1633    - Negative for intraepithelial lesion or malignancy (NILM)   HPVHIGH Negative 06/20/2020 1633   ADEQPAP  06/20/2020 1633    Satisfactory for evaluation; transformation zone component PRESENT.    OB History  Gravida Para Term Preterm AB Living  1 1       1   SAB IAB Ectopic Multiple Live Births               # Outcome Date GA Lbr Len/2nd Weight Sex Type Anes PTL Lv  1 Para             Past Medical History:  Diagnosis Date   Arthritis    Crohn's ileitis (HCC)    followed by dr Adela Lank--  mild crohn's ileitis (distal small bowel)   Endometrial polyp    GERD (gastroesophageal reflux disease)    History of chronic gastritis    History of gastric ulcer    History of kidney stones    Ovarian mass    PONV (postoperative nausea and vomiting)    Seasonal allergies     Past Surgical History:  Procedure Laterality Date   CYSTOSCOPY/RETROGRADE/URETEROSCOPY/STONE EXTRACTION WITH BASKET   x4  last one 2014 approx.   DILATATION & CURETTAGE/HYSTEROSCOPY WITH MYOSURE N/A 07/12/2017   Procedure: DILATATION & CURETTAGE/HYSTEROSCOPY WITH MYOSURE;  Surgeon: Dara Lords, MD;  Location: [facility];  Service: Gynecology;  Laterality: N/A;   KIDNEY SURGERY Left 75 (age 51)   per pt " repaired blood flow to kidney"   LAPAROSCOPIC BILATERAL SALPINGO OOPHERECTOMY Bilateral 07/12/2017   Procedure: LAPAROSCOPIC BILATERAL SALPINGO OOPHORECTOMY, RIGHT OVARIAN CYSTECTOMY, PELVIC WASHINGS;  Surgeon: Dara Lords, MD;  Location: [facility];  Service: Gynecology;  Laterality: Bilateral;   MICROLARYNGOSCOPY WITH CO2 LASER AND EXCISION OF VOCAL CORD LESION  2004   nodules removed (per pt benign)   NASAL SINUS SURGERY  2010   TONSILLECTOMY AND ADENOIDECTOMY  age 36    Current Outpatient Medications on File Prior to Visit  Medication Sig Dispense Refill   dicyclomine (BENTYL) 10 MG capsule Take 1-2 capsules (10-20 mg total) by mouth every 6 (six) hours as needed for spasms. 90 capsule 6   No current facility-administered medications on file prior to visit.   Family History  Problem Relation Age of Onset   Breast cancer Mother 61   Clotting disorder Mother    Diabetes  Mother    Liver disease Mother    Bladder Cancer Father    Cancer - Other Father        bile duct cancer   Colon polyps Brother    Stomach cancer Neg Hx    Colon cancer Neg Hx    Esophageal cancer Neg Hx    Pancreatic cancer Neg Hx     Allergies  Allergen Reactions   Codeine Nausea And Vomiting   Amoxicillin Rash     Blood pressure 114/76, height 5\' 3"  (1.6 m), weight 178 lb (80.7 kg).  PE General appearance: alert, cooperative and appears stated age Head: Normocephalic, without obvious abnormality, atraumatic Neck: no adenopathy, supple, symmetrical, thyroid normal to inspection and palpation Lungs: normal respiratory effort Breasts: normal appearance, no masses or  tenderness Abdomen: soft, non-tender; bowel sounds normal; no masses,  no organomegaly Extremities: extremities normal, atraumatic, no cyanosis or edema Skin: Skin color, texture normal. No rashes or lesions Lymph nodes: axillary and inguinal nodes normal. Neurologic: Grossly normal     Pelvic: External genitalia:  no lesions              Urethra:  normal appearing urethra with no masses, tenderness or lesions              Bartholins and Skenes: normal                 Vagina: normal appearing vagina with normal color and discharge, no lesions.               Cervix: no lesions, no cervical motion tenderness               Bimanual Exam:  Uterus:  normal size, contour, position, consistency, mobility, non-tender              Adnexa: no mass, fullness, tenderness          Chaperone was present for exam.   A:         Well Woman GYN exam Encounter for gynecological examination without abnormal finding - Plan: Cervical or vaginal cytopath, thin prep              P:        Pap smear collected, reviewed PAP screening guideline with patient, wants to continue PAP screening at this time             Encouraged annual mammogram screening             Colonoscopy UTD             Labs and immunizations with her primary             Discussed breast self exams             Encouraged safe sexual practices and enouraged healthy lifestyle practices with diet and exercise   Kilani Joffe V [employer]

## 2022-11-16 LAB — CYTOLOGY - PAP: Diagnosis: NEGATIVE

## 2022-12-04 ENCOUNTER — Other Ambulatory Visit: Payer: Self-pay | Admitting: Physician Assistant

## 2023-02-26 IMAGING — MR MR ^MR ENTEROGRAPHY W/WO
21 of 26 series · 38 of 48 positions shown · IV contrast (8ML GADAVIST)
Comparison: MR enterography, 12/01/2018, CT enterography abdomen
pelvis, 04/28/2017

CLINICAL DATA: Diarrhea, Crohn's disease

EXAM:
MR ABDOMEN AND PELVIS WITHOUT AND WITH CONTRAST (MR ENTEROGRAPHY)
TECHNIQUE: Multiplanar, multisequence MRI of the abdomen and pelvis was
performed both before and during bolus administration of intravenous
contrast. Negative oral contrast VoLumen was given.
CONTRAST:  8mL GADAVIST GADOBUTROL 1 MMOL/ML IV SOLN

[Series 3: DWI · axial · 6.0mm · 1.49mm/px · z∈[-223,+180]mm · 3 of 114 slices shown (1 of 2)]
[im 1/114]
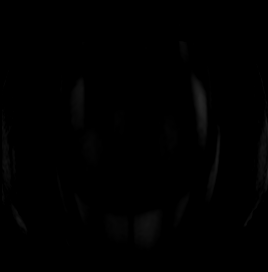
[im 57/114]
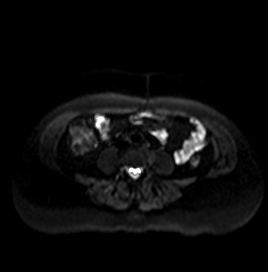
[im 114/114]
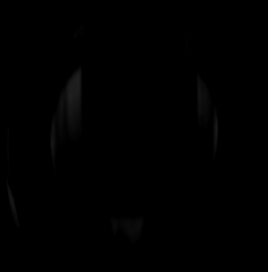

[Series 4: DWI · axial · 6.0mm · 1.49mm/px · 1 of 57 slices shown (2 of 2)]
[im 1/57]
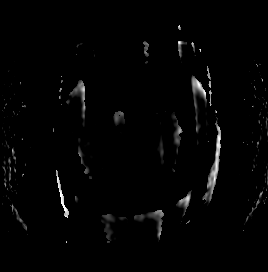

[Series 5: T2 · axial · 6.0mm · 1.56mm/px · 1 of 54 slices shown (1 of 2)]
[im 1/54]
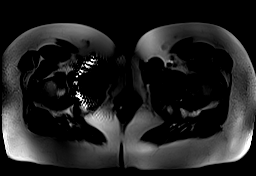

[Series 6: T2 · coronal · 6.0mm · 1.76mm/px · 1 of 30 slices shown (2 of 2)]
[im 1/30]
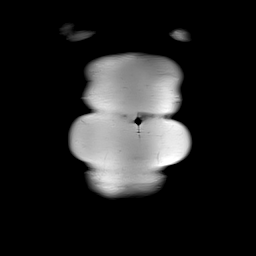

[Series 7: bSSFP · coronal · 6.0mm · 0.88mm/px · 1 of 35 slices shown]
[im 1/35]
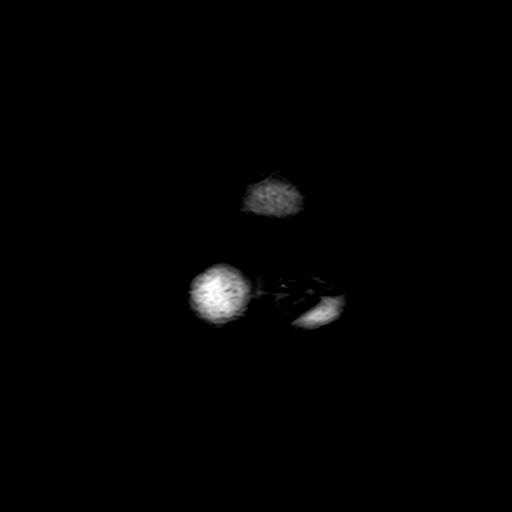

[Series 8: T1 · axial · 3.0mm · 1.25mm/px · z∈[-224,+133]mm · 2 of 120 slices shown (1 of 2)]
[im 1/120]
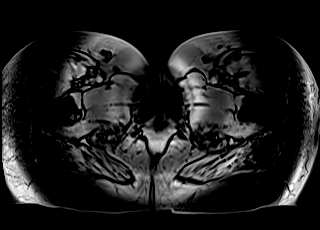
[im 120/120]
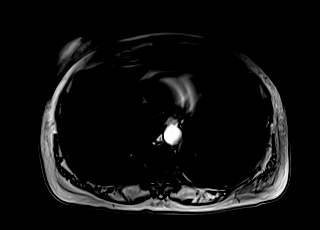

[Series 9: T1 · axial · 3.0mm · 1.25mm/px · z∈[-224,+133]mm · 2 of 120 slices shown (2 of 2)]
[im 1/120]
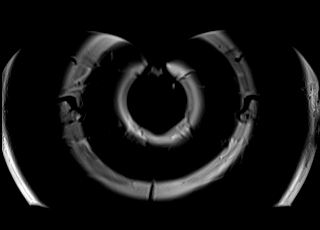
[im 120/120]
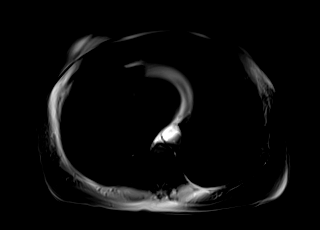

[Series 11: T1 dynamic · axial · 3.0mm · 1.31mm/px · z∈[-215,+142]mm · 2 of 120 slices shown (1 of 10)]
[im 1/120]
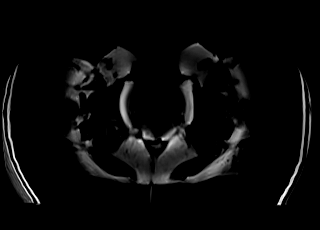
[im 120/120]
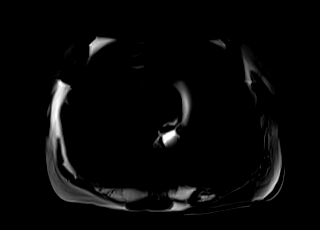

[Series 15: T1 dynamic · axial · 3.0mm · 1.31mm/px · z∈[-215,+142]mm · 2 of 120 slices shown (2 of 10)]
[im 1/120]
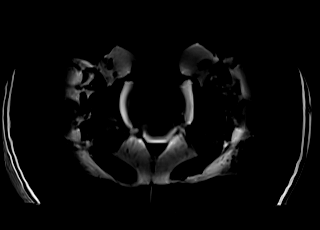
[im 120/120]
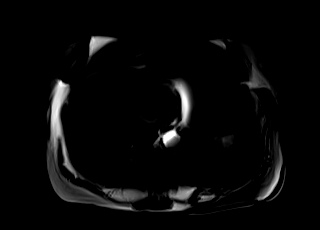

[Series 16: T1 dynamic · axial · 3.0mm · 1.31mm/px · z∈[-215,+142]mm · 2 of 120 slices shown (3 of 10)]
[im 1/120]
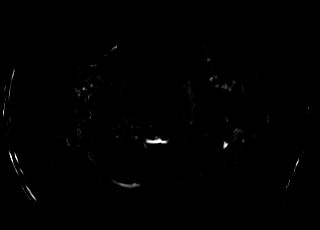
[im 120/120]
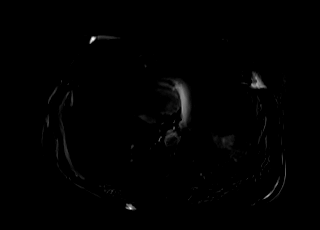

[Series 19: T1 dynamic · axial · 3.0mm · 1.31mm/px · z∈[-215,+142]mm · 2 of 120 slices shown (4 of 10)]
[im 1/120]
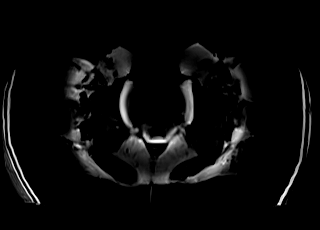
[im 120/120]
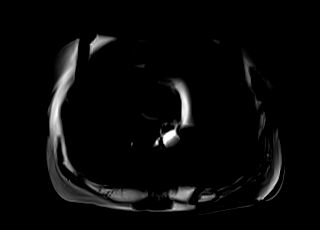

[Series 20: T1 dynamic · axial · 3.0mm · 1.31mm/px · z∈[-215,+142]mm · 2 of 120 slices shown (5 of 10)]
[im 1/120]
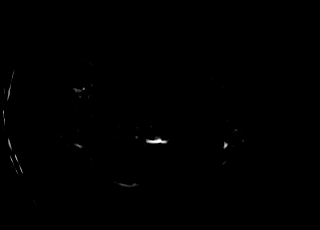
[im 120/120]
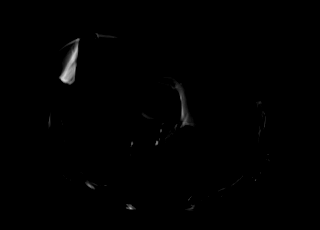

[Series 23: T1 dynamic · axial · 3.0mm · 1.31mm/px · z∈[-215,+142]mm · 2 of 120 slices shown (6 of 10)]
[im 1/120]
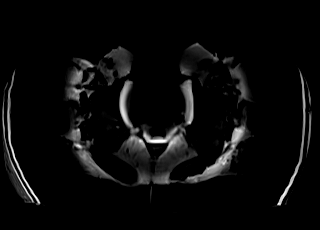
[im 120/120]
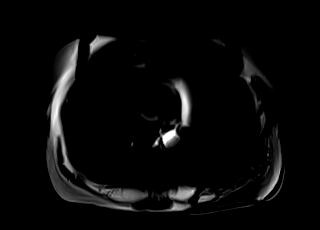

[Series 24: T1 dynamic · axial · 3.0mm · 1.31mm/px · z∈[-215,+142]mm · 2 of 120 slices shown (7 of 10)]
[im 1/120]
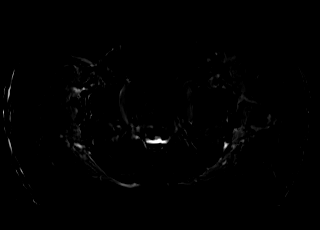
[im 120/120]
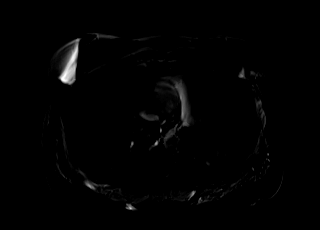

[Series 26: T1 dynamic · coronal · 3.0mm · 1.41mm/px · 1 of 72 slices shown (8 of 10)]
[im 1/72]
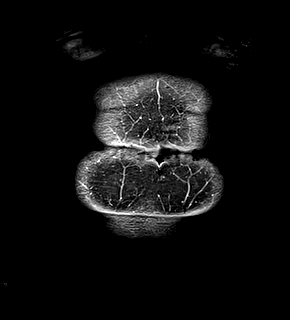

[Series 29: T1 dynamic · axial · 3.0mm · 1.31mm/px · z∈[-215,+142]mm · 2 of 120 slices shown (9 of 10)]
[im 1/120]
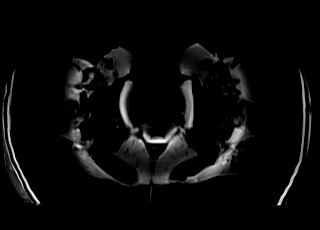
[im 120/120]
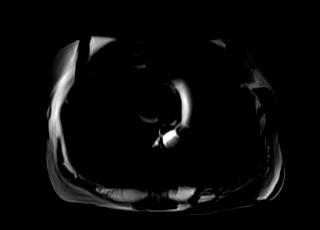

[Series 30: T1 dynamic · axial · 3.0mm · 1.31mm/px · z∈[-215,+142]mm · 2 of 120 slices shown (10 of 10)]
[im 1/120]
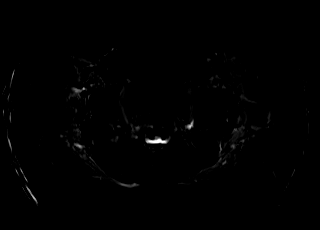
[im 120/120]
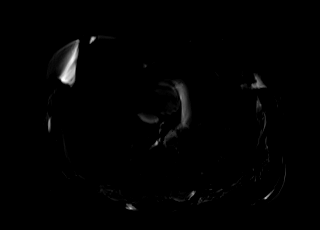

[Series 31: cor cine true-fisp · coronal · 10.0mm · 0.74mm/px · 2 of 100 slices shown (1 of 4)]
[im 1/100]
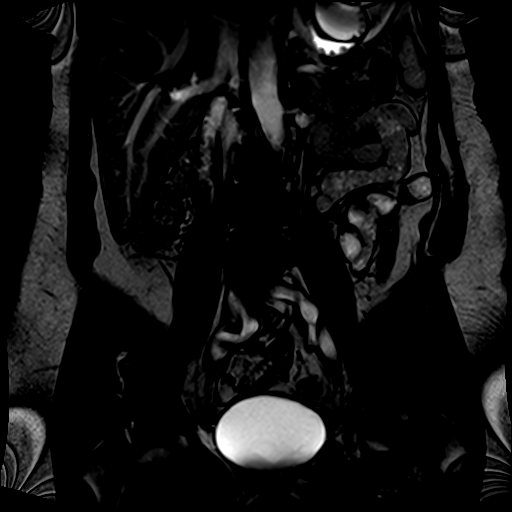
[im 100/100]
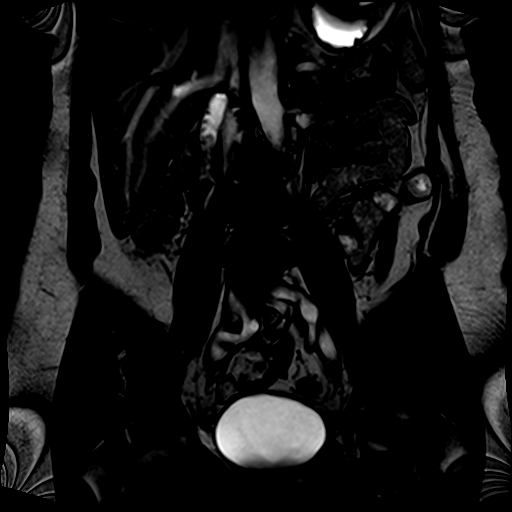

[Series 32: cor cine true-fisp · coronal · 10.0mm · 0.74mm/px · 2 of 100 slices shown (2 of 4)]
[im 1/100]
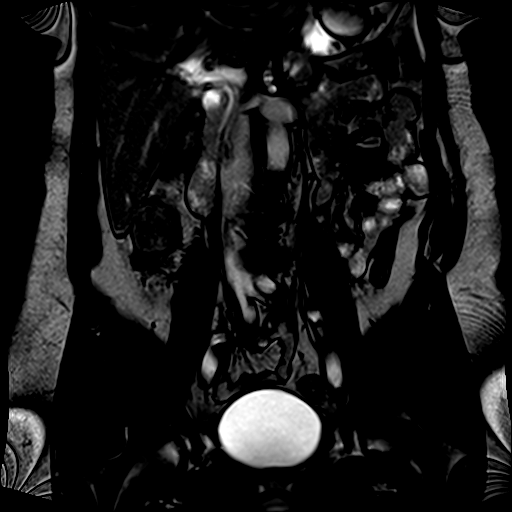
[im 100/100]
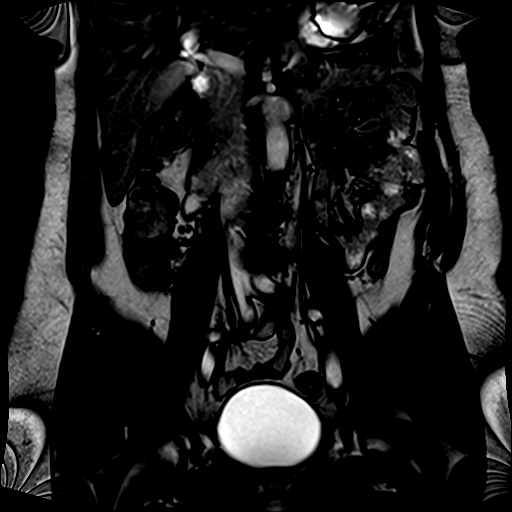

[Series 33: cor cine true-fisp · coronal · 10.0mm · 0.74mm/px · 2 of 100 slices shown (3 of 4)]
[im 1/100]
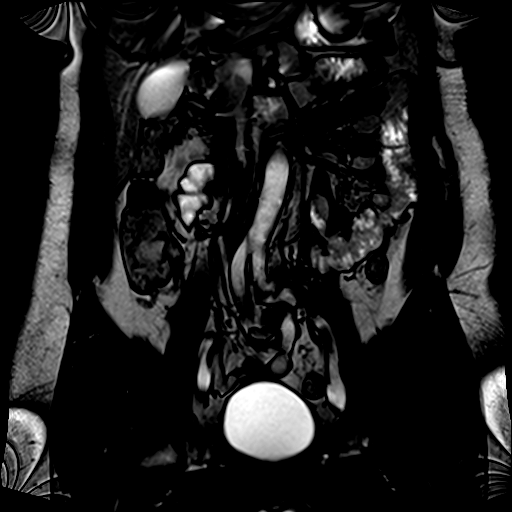
[im 100/100]
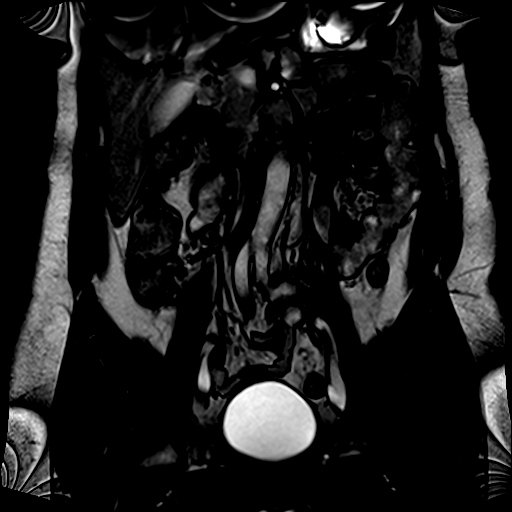

[Series 34: cor cine true-fisp · coronal · 10.0mm · 0.74mm/px · 2 of 100 slices shown (4 of 4)]
[im 1/100]
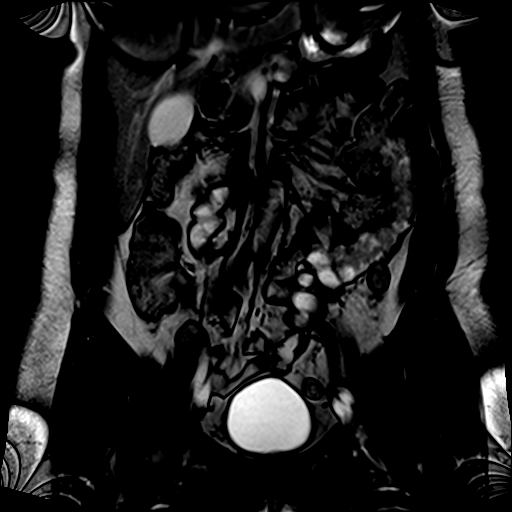
[im 100/100]
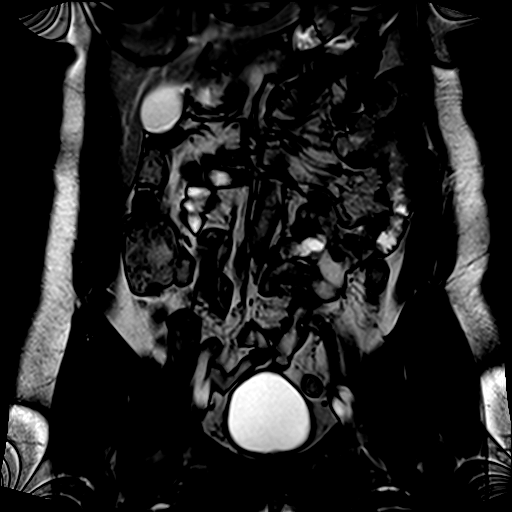

[38 of 48 positions shown; findings below may reference images not displayed]

FINDINGS: COMBINED FINDINGS FOR BOTH MR ABDOMEN AND PELVIS

Examination is generally somewhat limited by breath motion artifact
throughout.

Lower chest: No acute findings.

Hepatobiliary: Stable, benign hemangioma of the medial posterior
liver dome, hepatic segment VII (series 19, image 60). No solid mass
or other parenchymal abnormality identified. No gallstones. No
biliary ductal dilatation.

Pancreas: No mass, inflammatory changes, or other parenchymal
abnormality identified. No pancreatic ductal dilatation.

Spleen:  Within normal limits in size and appearance.

Adrenals/Urinary Tract: No masses identified. Unchanged cortical
scarring of the posterior superior pole of the left kidney (series
19, image 20). No evidence of hydronephrosis.

Stomach/Bowel: Limited distension of the small bowel. Although
sequences are somewhat limited by breath and bowel motion artifact,
findings again suggest some inflammatory involvement of the proximal
small bowel in the left hemiabdomen, with wall thickening and some
mucosal hyperenhancement (e.g. Series 15, image 43). No evidence of
stricture, obstruction, fistula, or abscess. Sigmoid diverticula.

Vascular/Lymphatic: No pathologically enlarged lymph nodes
identified. No abdominal aortic aneurysm demonstrated.

Reproductive: Status post bilateral salpingooophorectomy.
Unremarkable uterus.

Other:  None.

Musculoskeletal: No suspicious bone lesions identified.
IMPRESSION: 1. Although sequences are somewhat limited by breath and bowel
motion artifact throughout, findings again suggest some inflammatory
involvement of the proximal small bowel in the left hemiabdomen,
with wall thickening and some mucosal hyperenhancement. No
inflammatory findings of the distal small bowel or colon. Given
technical limitations of this examination, CT enterography is more
robust to motion artifact and may be helpful to further evaluate.
2. No evidence of complicating stricture, obstruction, fistula, or
abscess.
3. Sigmoid diverticulosis.

## 2023-07-18 ENCOUNTER — Encounter: Payer: Self-pay | Admitting: Neurology

## 2023-08-16 ENCOUNTER — Encounter: Payer: Self-pay | Admitting: Cardiovascular Disease

## 2023-08-16 ENCOUNTER — Ambulatory Visit: Attending: Cardiovascular Disease | Admitting: Cardiovascular Disease

## 2023-08-16 VITALS — BP 90/68 | HR 67 | Ht 63.0 in | Wt 173.0 lb

## 2023-08-16 DIAGNOSIS — R0609 Other forms of dyspnea: Secondary | ICD-10-CM | POA: Diagnosis not present

## 2023-08-16 DIAGNOSIS — Z8249 Family history of ischemic heart disease and other diseases of the circulatory system: Secondary | ICD-10-CM | POA: Diagnosis not present

## 2023-08-16 NOTE — Assessment & Plan Note (Signed)
 Father has what sounds like bicuspid aortic valve and thoracic aortic aneurysm treated surgically.  2 of her siblings did as well.

## 2023-08-16 NOTE — Patient Instructions (Addendum)
 Medication Instructions:  Your physician recommends that you continue on your current medications as directed. Please refer to the Current Medication list given to you today.  *If you need a refill on your cardiac medications before your next appointment, please call your pharmacy*  Lab Work: If you have labs (blood work) drawn today and your tests are completely normal, you will receive your results only by: MyChart Message (if you have MyChart) OR A paper copy in the mail If you have any lab test that is abnormal or we need to change your treatment, we will call you to review the results.  Testing/Procedures: CT scanning for a cardiac calcium score (CAT scanning), is a noninvasive, special x-ray that produces cross-sectional images of the body using x-rays and a computer. CT scans help physicians diagnose and treat medical conditions. For some CT exams, a contrast material is used to enhance visibility in the area of the body being studied. CT scans provide greater clarity and reveal more details than regular x-ray exams.  Your physician has requested that you have an echocardiogram. Echocardiography is a painless test that uses sound waves to create images of your heart. It provides your doctor with information about the size and shape of your heart and how well your heart's chambers and valves are working. This procedure takes approximately one hour. There are no restrictions for this procedure. Please do NOT wear cologne, perfume, aftershave, or lotions (deodorant is allowed). Please arrive 15 minutes prior to your appointment time.  Please note: We ask at that you not bring children with you during ultrasound (echo/ vascular) testing. Due to room size and safety concerns, children are not allowed in the ultrasound rooms during exams. Our front office staff cannot provide observation of children in our lobby area while testing is being conducted. An adult accompanying a patient to their  appointment will only be allowed in the ultrasound room at the discretion of the ultrasound technician under special circumstances. We apologize for any inconvenience. Follow-Up: At [facility], you and your health needs are our priority.  As part of our continuing mission to provide you with exceptional heart care, our providers are all part of one team.  This team includes your primary Cardiologist (physician) and Advanced Practice Providers or APPs (Physician Assistants and Nurse Practitioners) who all work together to provide you with the care you need, when you need it.  Your next appointment:   1 year(s)  Provider:   Dorn Lesches, MD    We recommend signing up for the patient portal called MyChart.  Sign up information is provided on this After Visit Summary.  MyChart is used to connect with patients for Virtual Visits (Telemedicine).  Patients are able to view lab/test results, encounter notes, upcoming appointments, etc.  Non-urgent messages can be sent to your provider as well.   To learn more about what you can do with MyChart, go to [email or web address].

## 2023-08-16 NOTE — Progress Notes (Signed)
 08/16/2023 Alyssa Strong   [birth year]  [id number]  Primary Physician Almeda Loa ORN, FNP Primary Cardiologist: Dorn JINNY Lesches MD [facility] [place], [place]  HPI:  Alyssa Strong is a 68 y.o. mildly overweight married Caucasian female mother of 1 daughter, grandmother of 3 grandchildren who is accompanied by her husband Laurel who is also a patient of mine.  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  She was referred because of family history of heart disease.  She basically has no cardiac risk factors other than a father who had valvular heart disease and a ascending thoracic aortic aortic aneurysm which was surgically corrected.  Apparently 2 of her siblings also did as well.  She has never had a heart attack or stroke.  She has complained of some increasing dyspnea over the last year or so.  She is in addition under a lot of stress at work.  She was recently diagnosed with trigeminal neuralgia.   Current Meds  Medication Sig   OXcarbazepine (TRILEPTAL) 150 MG tablet Take 150 mg by mouth 2 (two) times daily.     Allergies  Allergen Reactions   Codeine Nausea And Vomiting   Amoxicillin Rash    Social History   Socioeconomic History   Marital status: Married    Spouse name: Not on file   Number of children: 1   Years of education: Not on file   Highest education level: Not on file  Occupational History   Occupation: Administrative Assist  Tobacco Use   Smoking status: Former    Current packs/day: 0.00    Types: Cigarettes    Start date: 07/06/1974    Quit date: 07/05/1976    Years since quitting: 47.1    Passive exposure: Never   Smokeless tobacco: Never   Tobacco comments:    in high school  Vaping Use   Vaping status: Never Used  Substance and Sexual Activity   Alcohol use: No   Drug use: No   Sexual activity: Yes    Partners: Male    Birth control/protection: Post-menopausal    Comment: sexual debut  over 17yo, less than 5 lifetime partners  Other Topics Concern   Not on file  Social History Narrative   Daughter is a PA at [employer]   Social Drivers of Health   Financial Resource Strain: Not on file  Food Insecurity: Not on file  Transportation Needs: Not on file  Physical Activity: Not on file  Stress: Not on file  Social Connections: Not on file  Intimate Partner Violence: Not on file     Review of Systems: General: negative for chills, fever, night sweats or weight changes.  Cardiovascular: negative for chest pain, dyspnea on exertion, edema, orthopnea, palpitations, paroxysmal nocturnal dyspnea or shortness of breath Dermatological: negative for rash Respiratory: negative for cough or wheezing Urologic: negative for hematuria Abdominal: negative for nausea, vomiting, diarrhea, bright red blood per rectum, melena, or hematemesis Neurologic: negative for visual changes, syncope, or dizziness All other systems reviewed and are otherwise negative except as noted above.    Blood pressure 90/68, pulse 67, height 5' 3 (1.6 m), weight 173 lb (78.5 kg), SpO2 99%.  General appearance: alert and no distress Neck: no adenopathy, no carotid bruit, no JVD, supple, symmetrical, trachea midline, and thyroid not enlarged, symmetric, no tenderness/mass/nodules Lungs: clear to auscultation bilaterally Heart: regular rate and rhythm, S1, S2 normal, no  murmur, click, rub or gallop Extremities: extremities normal, atraumatic, no cyanosis or edema Pulses: 2+ and symmetric Skin: Skin color, texture, turgor normal. No rashes or lesions Neurologic: Grossly normal  EKG EKG Interpretation Date/Time:  Tuesday August 16 2023 11:01:50 EDT Ventricular Rate:  67 PR Interval:  146 QRS Duration:  78 QT Interval:  420 QTC Calculation: 443 R Axis:   -31  Text Interpretation: Normal sinus rhythm Left axis deviation Pulmonary disease pattern No previous ECGs available Confirmed by Court Carrier  [id number]) on 08/16/2023 11:07:30 AM    ASSESSMENT AND PLAN:   Family history of valvular heart disease Father has what sounds like bicuspid aortic valve and thoracic aortic aneurysm treated surgically.  2 of her siblings did as well.     Carrier DOROTHA Court MD FACP,FACC,FAHA, [facility] 08/16/2023 11:19 AM

## 2023-09-07 ENCOUNTER — Ambulatory Visit: Admitting: Neurology

## 2023-09-13 ENCOUNTER — Ambulatory Visit (HOSPITAL_COMMUNITY)

## 2023-10-07 ENCOUNTER — Ambulatory Visit (HOSPITAL_COMMUNITY)

## 2023-10-31 ENCOUNTER — Encounter: Payer: Self-pay | Admitting: Neurology

## 2023-10-31 ENCOUNTER — Ambulatory Visit (INDEPENDENT_AMBULATORY_CARE_PROVIDER_SITE_OTHER): Admitting: Neurology

## 2023-10-31 VITALS — BP 109/76 | HR 78 | Ht 63.0 in | Wt 165.0 lb

## 2023-10-31 DIAGNOSIS — G500 Trigeminal neuralgia: Secondary | ICD-10-CM | POA: Diagnosis not present

## 2023-10-31 MED ORDER — OXCARBAZEPINE 150 MG PO TABS: 150.0000 mg | ORAL_TABLET | Freq: Two times a day (BID) | ORAL | 1 refills | Status: AC

## 2023-10-31 NOTE — Progress Notes (Signed)
 [facility] Clinic Note - Initial Visit   Date: 10/31/2023   Alyssa Strong MRN: [id number] DOB: December 08, 1955   Dear Loa Lamp, FNP:  Thank you for your kind referral of Alyssa Strong for consultation of trigeminal neuralgia. Although her history is well known to you, please allow us  to reiterate it for the purpose of our medical record. The patient was accompanied to the clinic by self.   Alyssa Strong is a 68 y.o. right-handed female with Crohn's disease presenting for evaluation of trigeminal neuralgia.   IMPRESSION/PLAN: Right trigeminal neuralgia, controlled on oxcarbazepine  150mg  BID which should be continued.  If pain gets worse, this can be increased. I recommend checking sodium as higher doses of oxcarbazepine  can be hyponatremia.  MRI brain was normal.  I discussed that dedicated imaging of the trigeminal nerve can be ordered, but it was decided to hold testing unless symptoms significantly intensify.   Return to clinic in 6 months  ------------------------------------------------------------- History of present illness: Starting end of May 2025, she began to have right sided sharp, shooting electrical pain over the forehead, which was triggered by brushing, chewing, and talking.  She was started on oxcarbazepine  150 twice daily which has controlled her symptoms well.  If she has been talking a lot, then it can aggravate symptoms more and she takes an extra tablet which is effective.   Out-side paper records, electronic medical record, and images have been reviewed where available and summarized as:  MRI brain wwo contrast 07/07/2023:  No evidence of acute intracranial pathology.  No masses are identified.   Lab Results  Component Value Date   VITAMINB12 525 07/10/2021   Lab Results  Component Value Date   TSH 1.71 06/20/2020   Lab Results  Component Value Date   ESRSEDRATE 14 01/05/2021    Past Medical History:  Diagnosis Date   Arthritis     Crohn's ileitis (HCC)    followed by dr leigh--  mild crohn's ileitis (distal small bowel)   Endometrial polyp    GERD (gastroesophageal reflux disease)    History of chronic gastritis    History of gastric ulcer    History of kidney stones    Ovarian mass    PONV (postoperative nausea and vomiting)    Seasonal allergies     Past Surgical History:  Procedure Laterality Date   CYSTOSCOPY/RETROGRADE/URETEROSCOPY/STONE EXTRACTION WITH BASKET  x4  last one 2014 approx.   DILATATION & CURETTAGE/HYSTEROSCOPY WITH MYOSURE N/A 07/12/2017   Procedure: DILATATION & CURETTAGE/HYSTEROSCOPY WITH MYOSURE;  Surgeon: Rockney Evalene SQUIBB, MD;  Location: [facility];  Service: Gynecology;  Laterality: N/A;   KIDNEY SURGERY Left 27 (age 64)   per pt  repaired blood flow to kidney   LAPAROSCOPIC BILATERAL SALPINGO OOPHERECTOMY Bilateral 07/12/2017   Procedure: LAPAROSCOPIC BILATERAL SALPINGO OOPHORECTOMY, RIGHT OVARIAN CYSTECTOMY, PELVIC WASHINGS;  Surgeon: Rockney Evalene SQUIBB, MD;  Location: [facility];  Service: Gynecology;  Laterality: Bilateral;   MICROLARYNGOSCOPY WITH CO2 LASER AND EXCISION OF VOCAL CORD LESION  2004   nodules removed (per pt benign)   NASAL SINUS SURGERY  2010   TONSILLECTOMY AND ADENOIDECTOMY  age 41     Medications:  Outpatient Encounter Medications as of 10/31/2023  Medication Sig   OXcarbazepine  (TRILEPTAL ) 150 MG tablet Take 150 mg by mouth 2 (two) times daily.   No facility-administered encounter medications on file as of 10/31/2023.    Allergies:  Allergies  Allergen Reactions   Codeine Nausea And Vomiting  Amoxicillin Rash    Family History: Family History  Problem Relation Age of Onset   Breast cancer Mother 45   Clotting disorder Mother    Diabetes Mother    Liver disease Mother    Bladder Cancer Father    Cancer - Other Father        bile duct cancer   Colon polyps Brother    Stomach cancer Neg Hx    Colon  cancer Neg Hx    Esophageal cancer Neg Hx    Pancreatic cancer Neg Hx     Social History: Social History   Tobacco Use   Smoking status: Former    Current packs/day: 0.00    Types: Cigarettes    Start date: 07/06/1974    Quit date: 07/05/1976    Years since quitting: 47.3    Passive exposure: Never   Smokeless tobacco: Never   Tobacco comments:    in high school  Vaping Use   Vaping status: Never Used  Substance Use Topics   Alcohol use: No   Drug use: No   Social History   Social History Narrative   Daughter is a PA at [employer]   Are you right handed or left handed? Right   Are you currently employed ?    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   Who lives with you? husband   What type of home do you live in: 1 story or 2 story? two   Caffiene none     Vital Signs:  BP 109/76   Pulse 78   Ht 5' 3 (1.6 m)   Wt 165 lb (74.8 kg)   SpO2 99%   BMI 29.23 kg/m   Neurological Exam: MENTAL STATUS including orientation to time, place, person, recent and remote memory, attention span and concentration, language, and fund of knowledge is normal.  Speech is not dysarthric.  CRANIAL NERVES: II:  No visual field defects.     III-IV-VI: Pupils equal round and reactive to light.  Normal conjugate, extra-ocular eye movements in all directions of gaze.  No nystagmus.  No ptosis.   V:  Normal facial sensation.    VII:  Normal facial symmetry and movements.   VIII:  Normal hearing and vestibular function.   IX-X:  Normal palatal movement.   XI:  Normal shoulder shrug and head rotation.   XII:  Normal tongue strength and range of motion, no deviation or fasciculation.  MOTOR:  Motor strength is 5/5 throughout.  No atrophy, fasciculations or abnormal movements.  No pronator drift.   MSRs:                                           Right        Left brachioradialis 2+  2+  biceps 2+  2+  triceps 2+  2+  patellar 2+  2+  ankle jerk  2+  2+  plantar response down  down   SENSORY:  Normal and symmetric perception of light touch, pinprick, vibration, and temperature.  Romberg's sign absent.   COORDINATION/GAIT: Normal finger-to- nose-finger.  Intact rapid alternating movements bilaterally.   Gait narrow based and stable.    Thank you for allowing me to participate in patient's care.  If I can answer any additional questions, I would be pleased to do  so.    Sincerely,    Briony Parveen K. Tobie, DO

## 2023-11-07 ENCOUNTER — Ambulatory Visit (HOSPITAL_COMMUNITY)
Admission: RE | Admit: 2023-11-07 | Discharge: 2023-11-07 | Disposition: A | Discharge status: Home / Self Care | Source: Ambulatory Visit | Attending: Cardiology | Admitting: Cardiology

## 2023-11-07 ENCOUNTER — Ambulatory Visit: Payer: Self-pay | Admitting: Cardiovascular Disease

## 2023-11-07 DIAGNOSIS — Z8249 Family history of ischemic heart disease and other diseases of the circulatory system: Secondary | ICD-10-CM | POA: Insufficient documentation

## 2023-11-07 DIAGNOSIS — R0609 Other forms of dyspnea: Secondary | ICD-10-CM | POA: Diagnosis present

## 2023-11-07 LAB — ECHOCARDIOGRAM COMPLETE
Area-P 1/2: 2.84 cm2
P 1/2 time: 723 ms
S' Lateral: 2.7 cm

## 2024-05-01 ENCOUNTER — Ambulatory Visit: Admitting: Neurology
